# Patient Record
Sex: Female | Born: 1955 | Race: White | Hispanic: No | Marital: Single | Smoking: Never smoker
Health system: Southern US, Community
[De-identification: ages and names within clinical notes are randomized; demographics above are authoritative.]

---

## 1989-06-29 HISTORY — PX: AUGMENTATION MAMMAPLASTY: SUR837

## 1992-06-29 HISTORY — PX: BREAST IMPLANT REMOVAL: SUR1101

## 1998-06-17 ENCOUNTER — Ambulatory Visit (HOSPITAL_COMMUNITY): Admission: RE | Admit: 1998-06-17 | Discharge: 1998-06-17 | Payer: Self-pay | Admitting: *Deleted

## 1998-07-22 ENCOUNTER — Other Ambulatory Visit: Admission: RE | Admit: 1998-07-22 | Discharge: 1998-07-22 | Payer: Self-pay | Admitting: *Deleted

## 1998-09-25 ENCOUNTER — Ambulatory Visit (HOSPITAL_COMMUNITY): Admission: RE | Admit: 1998-09-25 | Discharge: 1998-09-25 | Payer: Self-pay | Admitting: Internal Medicine

## 1998-09-25 ENCOUNTER — Encounter: Payer: Self-pay | Admitting: Internal Medicine

## 1999-05-16 ENCOUNTER — Other Ambulatory Visit: Admission: RE | Admit: 1999-05-16 | Discharge: 1999-05-16 | Payer: Self-pay | Admitting: *Deleted

## 2000-03-02 ENCOUNTER — Encounter: Payer: Self-pay | Admitting: Emergency Medicine

## 2000-03-02 ENCOUNTER — Emergency Department (HOSPITAL_COMMUNITY): Admission: EM | Admit: 2000-03-02 | Discharge: 2000-03-02 | Payer: Self-pay | Admitting: Emergency Medicine

## 2000-08-13 ENCOUNTER — Encounter: Admission: RE | Admit: 2000-08-13 | Discharge: 2000-08-13 | Payer: Self-pay | Admitting: Internal Medicine

## 2000-08-13 ENCOUNTER — Encounter: Payer: Self-pay | Admitting: Internal Medicine

## 2001-08-24 ENCOUNTER — Encounter: Admission: RE | Admit: 2001-08-24 | Discharge: 2001-08-24 | Payer: Self-pay | Admitting: Internal Medicine

## 2001-08-24 ENCOUNTER — Encounter: Payer: Self-pay | Admitting: Internal Medicine

## 2002-09-12 ENCOUNTER — Encounter: Payer: Self-pay | Admitting: Internal Medicine

## 2002-09-12 ENCOUNTER — Ambulatory Visit (HOSPITAL_COMMUNITY): Admission: RE | Admit: 2002-09-12 | Discharge: 2002-09-12 | Payer: Self-pay | Admitting: Internal Medicine

## 2003-08-23 ENCOUNTER — Encounter: Admission: RE | Admit: 2003-08-23 | Discharge: 2003-08-23 | Payer: Self-pay | Admitting: Internal Medicine

## 2005-01-28 ENCOUNTER — Emergency Department (HOSPITAL_COMMUNITY): Admission: EM | Admit: 2005-01-28 | Discharge: 2005-01-28 | Payer: Self-pay | Admitting: Emergency Medicine

## 2005-04-21 ENCOUNTER — Other Ambulatory Visit: Admission: RE | Admit: 2005-04-21 | Discharge: 2005-04-21 | Payer: Self-pay | Admitting: Obstetrics and Gynecology

## 2005-08-17 ENCOUNTER — Ambulatory Visit (HOSPITAL_COMMUNITY): Admission: RE | Admit: 2005-08-17 | Discharge: 2005-08-17 | Payer: Self-pay | Admitting: Internal Medicine

## 2005-10-29 ENCOUNTER — Encounter: Admission: RE | Admit: 2005-10-29 | Discharge: 2005-10-29 | Payer: Self-pay | Admitting: Internal Medicine

## 2006-03-03 ENCOUNTER — Encounter: Admission: RE | Admit: 2006-03-03 | Discharge: 2006-03-03 | Payer: Self-pay | Admitting: Internal Medicine

## 2006-11-25 ENCOUNTER — Other Ambulatory Visit: Admission: RE | Admit: 2006-11-25 | Discharge: 2006-11-25 | Payer: Self-pay | Admitting: Gynecology

## 2007-03-15 ENCOUNTER — Other Ambulatory Visit: Admission: RE | Admit: 2007-03-15 | Discharge: 2007-03-15 | Payer: Self-pay | Admitting: Gynecology

## 2007-06-03 ENCOUNTER — Ambulatory Visit (HOSPITAL_COMMUNITY): Admission: RE | Admit: 2007-06-03 | Discharge: 2007-06-03 | Payer: Self-pay | Admitting: Internal Medicine

## 2007-06-13 ENCOUNTER — Encounter: Admission: RE | Admit: 2007-06-13 | Discharge: 2007-06-13 | Payer: Self-pay | Admitting: Internal Medicine

## 2007-09-14 ENCOUNTER — Ambulatory Visit: Payer: Self-pay | Admitting: Internal Medicine

## 2007-10-05 ENCOUNTER — Ambulatory Visit: Payer: Self-pay | Admitting: Internal Medicine

## 2007-11-30 ENCOUNTER — Other Ambulatory Visit: Admission: RE | Admit: 2007-11-30 | Discharge: 2007-11-30 | Payer: Self-pay | Admitting: Gynecology

## 2008-07-12 ENCOUNTER — Ambulatory Visit (HOSPITAL_COMMUNITY): Admission: RE | Admit: 2008-07-12 | Discharge: 2008-07-12 | Payer: Self-pay | Admitting: Internal Medicine

## 2008-08-09 ENCOUNTER — Encounter: Admission: RE | Admit: 2008-08-09 | Discharge: 2008-08-09 | Payer: Self-pay | Admitting: Internal Medicine

## 2009-10-14 ENCOUNTER — Ambulatory Visit: Payer: Self-pay | Admitting: General Practice

## 2009-12-18 ENCOUNTER — Other Ambulatory Visit: Payer: Self-pay | Admitting: General Practice

## 2010-04-09 ENCOUNTER — Ambulatory Visit: Payer: Self-pay | Admitting: Internal Medicine

## 2010-05-05 ENCOUNTER — Ambulatory Visit: Payer: Self-pay | Admitting: Internal Medicine

## 2010-05-29 ENCOUNTER — Ambulatory Visit: Payer: Self-pay | Admitting: Internal Medicine

## 2010-06-29 ENCOUNTER — Ambulatory Visit: Payer: Self-pay | Admitting: Internal Medicine

## 2010-07-20 ENCOUNTER — Encounter: Payer: Self-pay | Admitting: Internal Medicine

## 2012-01-28 ENCOUNTER — Ambulatory Visit: Payer: Self-pay | Admitting: Internal Medicine

## 2012-02-16 ENCOUNTER — Ambulatory Visit: Payer: Self-pay | Admitting: Physical Medicine and Rehabilitation

## 2013-03-01 ENCOUNTER — Ambulatory Visit: Payer: Self-pay | Admitting: Internal Medicine

## 2014-03-16 DIAGNOSIS — I10 Essential (primary) hypertension: Secondary | ICD-10-CM | POA: Insufficient documentation

## 2014-05-17 ENCOUNTER — Ambulatory Visit: Payer: Self-pay | Admitting: Internal Medicine

## 2015-01-14 ENCOUNTER — Encounter: Payer: Self-pay | Admitting: Internal Medicine

## 2015-02-21 ENCOUNTER — Other Ambulatory Visit: Payer: Self-pay | Admitting: Physician Assistant

## 2015-02-21 ENCOUNTER — Ambulatory Visit
Admission: RE | Admit: 2015-02-21 | Discharge: 2015-02-21 | Disposition: A | Discharge status: Home / Self Care | Payer: Managed Care, Other (non HMO) | Source: Ambulatory Visit | Attending: Physician Assistant | Admitting: Physician Assistant

## 2015-02-21 DIAGNOSIS — M25572 Pain in left ankle and joints of left foot: Secondary | ICD-10-CM | POA: Diagnosis not present

## 2015-02-21 DIAGNOSIS — R52 Pain, unspecified: Secondary | ICD-10-CM

## 2015-02-21 DIAGNOSIS — M25475 Effusion, left foot: Secondary | ICD-10-CM | POA: Diagnosis not present

## 2015-02-21 DIAGNOSIS — T1490XA Injury, unspecified, initial encounter: Secondary | ICD-10-CM

## 2015-02-21 DIAGNOSIS — R609 Edema, unspecified: Secondary | ICD-10-CM

## 2015-04-15 ENCOUNTER — Other Ambulatory Visit: Payer: Self-pay | Admitting: Internal Medicine

## 2015-04-15 DIAGNOSIS — Z1231 Encounter for screening mammogram for malignant neoplasm of breast: Secondary | ICD-10-CM

## 2015-05-21 ENCOUNTER — Ambulatory Visit: Payer: Managed Care, Other (non HMO)

## 2015-05-22 ENCOUNTER — Ambulatory Visit
Admission: RE | Admit: 2015-05-22 | Discharge: 2015-05-22 | Disposition: A | Discharge status: Home / Self Care | Payer: Managed Care, Other (non HMO) | Source: Ambulatory Visit | Attending: Internal Medicine | Admitting: Internal Medicine

## 2015-05-22 DIAGNOSIS — Z1231 Encounter for screening mammogram for malignant neoplasm of breast: Secondary | ICD-10-CM | POA: Insufficient documentation

## 2015-06-14 ENCOUNTER — Encounter: Payer: Self-pay | Admitting: Physician Assistant

## 2015-06-14 ENCOUNTER — Ambulatory Visit: Payer: Self-pay | Admitting: Physician Assistant

## 2015-06-14 VITALS — BP 130/80 | HR 84 | Temp 97.9°F

## 2015-06-14 DIAGNOSIS — T148XXA Other injury of unspecified body region, initial encounter: Secondary | ICD-10-CM

## 2015-06-14 NOTE — Progress Notes (Signed)
S: noticed a bruise on left inner thigh, now has a knot is same area, area is tender, no redness or swelling in leg  O: vitals wnl, nad, skin intact, small pea sized knot is area of bruise, no redness or warmth, n/v intact  A: hematoma  P: asa qd x 1 week, warm compress

## 2015-06-26 ENCOUNTER — Other Ambulatory Visit: Payer: Self-pay | Admitting: Emergency Medicine

## 2015-06-26 MED ORDER — OMEPRAZOLE 20 MG PO CPDR: 20.0000 mg | DELAYED_RELEASE_CAPSULE | Freq: Every day | ORAL | Status: DC

## 2015-06-26 NOTE — Telephone Encounter (Signed)
Received a faxed medication request from [facility].  Please advise.  Thank you.

## 2015-06-26 NOTE — Telephone Encounter (Signed)
Med refill approved 

## 2015-07-02 ENCOUNTER — Ambulatory Visit: Payer: Self-pay | Admitting: Family

## 2015-07-02 ENCOUNTER — Encounter: Payer: Self-pay | Admitting: Family

## 2015-07-02 VITALS — BP 130/90 | HR 82 | Temp 98.7°F | Resp 16

## 2015-07-02 DIAGNOSIS — E781 Pure hyperglyceridemia: Secondary | ICD-10-CM | POA: Insufficient documentation

## 2015-07-02 DIAGNOSIS — R739 Hyperglycemia, unspecified: Secondary | ICD-10-CM | POA: Insufficient documentation

## 2015-07-02 DIAGNOSIS — F1910 Other psychoactive substance abuse, uncomplicated: Secondary | ICD-10-CM | POA: Insufficient documentation

## 2015-07-02 DIAGNOSIS — M1990 Unspecified osteoarthritis, unspecified site: Secondary | ICD-10-CM | POA: Insufficient documentation

## 2015-07-02 DIAGNOSIS — Z8639 Personal history of other endocrine, nutritional and metabolic disease: Secondary | ICD-10-CM | POA: Insufficient documentation

## 2015-07-02 DIAGNOSIS — E039 Hypothyroidism, unspecified: Secondary | ICD-10-CM | POA: Insufficient documentation

## 2015-07-02 MED ORDER — FLUCONAZOLE 150 MG PO TABS: 150.0000 mg | ORAL_TABLET | Freq: Once | ORAL | Status: DC

## 2015-07-02 MED ORDER — LEVOFLOXACIN 500 MG PO TABS: 500.0000 mg | ORAL_TABLET | Freq: Every day | ORAL | Status: DC

## 2015-07-02 MED ORDER — BENZONATATE 200 MG PO CAPS: 200.0000 mg | ORAL_CAPSULE | Freq: Three times a day (TID) | ORAL | Status: DC | PRN

## 2015-07-02 MED ORDER — FLUCONAZOLE 200 MG PO TABS: 200.0000 mg | ORAL_TABLET | Freq: Every day | ORAL | Status: DC

## 2015-07-02 NOTE — Progress Notes (Signed)
S/ 3-4 d hx of deep cough and chest congestion , getting worse, fatigue , cough up yellow thick mucous , hears rattling in chest , a few years ago she had pneumonia  Non smoker, taking muccinex   O/ alert mildly ill, deep cough  VSS NAD ENT unremarkable, neck supple Heart rsr LUngs clear  A/ bronchitis P/levaquin 500 mg , tessalon pearles prn, diflucan for prn . Supportive measures encouraged. F/u prn not improving .

## 2015-07-09 ENCOUNTER — Ambulatory Visit: Payer: Self-pay | Admitting: Physician Assistant

## 2015-07-09 ENCOUNTER — Encounter: Payer: Self-pay | Admitting: Physician Assistant

## 2015-07-09 VITALS — BP 140/82 | HR 80 | Temp 99.1°F

## 2015-07-09 DIAGNOSIS — J209 Acute bronchitis, unspecified: Secondary | ICD-10-CM

## 2015-07-09 MED ORDER — IPRATROPIUM-ALBUTEROL 0.5-2.5 (3) MG/3ML IN SOLN
3.0000 mL | Freq: Once | RESPIRATORY_TRACT | Status: AC
  Administered 2015-07-10: 3 mL via RESPIRATORY_TRACT

## 2015-07-09 MED ORDER — PREDNISONE 10 MG (21) PO TBPK: 10.0000 mg | ORAL_TABLET | Freq: Every day | ORAL | Status: DC

## 2015-07-09 MED ORDER — HYDROCOD POLST-CPM POLST ER 10-8 MG/5ML PO SUER: 5.0000 mL | Freq: Two times a day (BID) | ORAL | Status: DC | PRN

## 2015-07-09 MED ORDER — CEFDINIR 300 MG PO CAPS: 300.0000 mg | ORAL_CAPSULE | Freq: Two times a day (BID) | ORAL | Status: DC

## 2015-07-09 MED ORDER — FLUCONAZOLE 150 MG PO TABS: ORAL_TABLET | ORAL | Status: DC

## 2015-07-09 NOTE — Progress Notes (Signed)
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

## 2015-07-10 ENCOUNTER — Ambulatory Visit
Admission: RE | Admit: 2015-07-10 | Discharge: 2015-07-10 | Disposition: A | Discharge status: Home / Self Care | Payer: Managed Care, Other (non HMO) | Source: Ambulatory Visit | Attending: Family | Admitting: Family

## 2015-07-10 ENCOUNTER — Other Ambulatory Visit: Payer: Self-pay | Admitting: Family

## 2015-07-10 ENCOUNTER — Ambulatory Visit: Payer: Self-pay | Admitting: Physician Assistant

## 2015-07-10 ENCOUNTER — Encounter: Payer: Self-pay | Admitting: Physician Assistant

## 2015-07-10 DIAGNOSIS — R05 Cough: Secondary | ICD-10-CM | POA: Diagnosis present

## 2015-07-10 DIAGNOSIS — J209 Acute bronchitis, unspecified: Secondary | ICD-10-CM

## 2015-07-10 DIAGNOSIS — R059 Cough, unspecified: Secondary | ICD-10-CM

## 2015-07-10 MED ORDER — IPRATROPIUM-ALBUTEROL 0.5-2.5 (3) MG/3ML IN SOLN
3.0000 mL | Freq: Once | RESPIRATORY_TRACT | Status: AC
  Administered 2015-07-10: 3 mL via RESPIRATORY_TRACT

## 2015-07-10 NOTE — Progress Notes (Signed)
S/ no better ,  Deep cough, SOB , On omnicef , prednisone without improvement   O/ alert not toxic but deep congested cough Rhonchi R  Posterior chest not clearing with cough  duoneb tx with subjective improvement but chest sounds unchanged  A/ Bronchitis -rule out pneumonia P rx written for neb machine and supplies.  Refer for cxr .

## 2015-07-10 NOTE — Addendum Note (Signed)
Addended by: Mirian MoMOORE, TOMMIE A on: 07/10/2015 03:23 PM   Modules accepted: Orders

## 2015-07-29 ENCOUNTER — Other Ambulatory Visit: Payer: Self-pay | Admitting: Physician Assistant

## 2015-07-29 DIAGNOSIS — J40 Bronchitis, not specified as acute or chronic: Secondary | ICD-10-CM

## 2015-07-29 MED ORDER — LEVOFLOXACIN 500 MG PO TABS: 500.0000 mg | ORAL_TABLET | Freq: Every day | ORAL | Status: DC

## 2015-07-29 NOTE — Progress Notes (Signed)
Phone call Pt still coughing up green mucus, no fever/chills, is better than she was but cough has been going on for a month Sent rx for levaquin to [facility]

## 2015-10-15 DIAGNOSIS — F325 Major depressive disorder, single episode, in full remission: Secondary | ICD-10-CM | POA: Insufficient documentation

## 2015-10-30 ENCOUNTER — Other Ambulatory Visit: Payer: Self-pay | Admitting: Physician Assistant

## 2015-10-30 NOTE — Telephone Encounter (Signed)
Med refill approved 

## 2016-02-11 ENCOUNTER — Other Ambulatory Visit: Payer: Self-pay | Admitting: Physician Assistant

## 2016-02-11 NOTE — Telephone Encounter (Signed)
Left message for patient to contact office. Needing additional information on why patient request this medicaton.

## 2016-02-11 NOTE — Telephone Encounter (Signed)
Patient returned call stating that Darl PikesSusan had prescribed this medication to her a while back when her cat scratched her and recently her daughters dog scratched her and the dog is up to date on shots and scratch is healing well but wanted it just in case and her husband accidentally threw medication  away.  Contacted patient back and left message that Per Darl PikesSusan if scratch is healing well doesn't require this medication.  So if she feel as though she needs it she would need to be seen so Darl PikesSusan can take a look at it doesn't want her body to become resistant to this medication.

## 2016-02-11 NOTE — Telephone Encounter (Signed)
Dois DavenportSandra,  Can you find out why she needs this?  I don't want her getting resistant to any antibiotics and not sure why she needs a refill

## 2016-04-15 ENCOUNTER — Other Ambulatory Visit: Payer: Self-pay | Admitting: Internal Medicine

## 2016-04-15 DIAGNOSIS — Z1231 Encounter for screening mammogram for malignant neoplasm of breast: Secondary | ICD-10-CM

## 2016-05-14 ENCOUNTER — Other Ambulatory Visit: Payer: Self-pay | Admitting: Physician Assistant

## 2016-05-14 DIAGNOSIS — J40 Bronchitis, not specified as acute or chronic: Secondary | ICD-10-CM

## 2016-05-14 MED ORDER — AZITHROMYCIN 250 MG PO TABS: ORAL_TABLET | ORAL | 0 refills | Status: DC

## 2016-05-14 MED ORDER — BENZONATATE 200 MG PO CAPS: 200.0000 mg | ORAL_CAPSULE | Freq: Three times a day (TID) | ORAL | 0 refills | Status: DC | PRN

## 2016-05-14 NOTE — Progress Notes (Signed)
S: pt c/o cough and congestion, got really bad last year and would like to get ahead of it before she gets to that point, ?if could get rx for tessalon perls, no fever chills, cp/sob  O: phone consult  A: uri  P: zpack, tessalon

## 2016-05-18 ENCOUNTER — Other Ambulatory Visit: Payer: Self-pay | Admitting: Physician Assistant

## 2016-05-18 MED ORDER — PREDNISONE 10 MG (21) PO TBPK: 10.0000 mg | ORAL_TABLET | Freq: Every day | ORAL | 0 refills | Status: DC

## 2016-05-18 NOTE — Progress Notes (Signed)
Pt still coughing, is getting harsher and more barking like last year, steroids helped last year, sent in rx [facility]

## 2016-05-20 ENCOUNTER — Other Ambulatory Visit: Payer: Self-pay | Admitting: Physician Assistant

## 2016-05-20 DIAGNOSIS — J209 Acute bronchitis, unspecified: Secondary | ICD-10-CM

## 2016-05-20 NOTE — Telephone Encounter (Signed)
pt has been on antibiotic, now has yeast, rx for diflucan called in

## 2016-05-27 ENCOUNTER — Ambulatory Visit
Admission: RE | Admit: 2016-05-27 | Discharge: 2016-05-27 | Disposition: A | Discharge status: Home / Self Care | Payer: Managed Care, Other (non HMO) | Source: Ambulatory Visit | Attending: Internal Medicine | Admitting: Internal Medicine

## 2016-05-27 ENCOUNTER — Encounter: Payer: Self-pay | Admitting: Radiology

## 2016-05-27 DIAGNOSIS — Z1231 Encounter for screening mammogram for malignant neoplasm of breast: Secondary | ICD-10-CM

## 2016-10-07 ENCOUNTER — Other Ambulatory Visit: Payer: Self-pay | Admitting: Physician Assistant

## 2016-10-07 MED ORDER — MONTELUKAST SODIUM 10 MG PO TABS: 10.0000 mg | ORAL_TABLET | Freq: Every day | ORAL | 3 refills | Status: DC

## 2016-10-07 MED ORDER — FLUTICASONE PROPIONATE 50 MCG/ACT NA SUSP: 2.0000 | Freq: Every day | NASAL | 6 refills | Status: DC

## 2016-10-07 NOTE — Progress Notes (Unsigned)
S: pt called the office and her allergies are worsening, getting dizzy from the inner congestion, eyes are watering, nose is running, no cp/sob/fever/chills  O: phone call  A: seasonal allergies  P: flonase, singulair, continue zyrtec

## 2016-12-01 ENCOUNTER — Ambulatory Visit: Payer: Self-pay | Admitting: Physician Assistant

## 2016-12-01 ENCOUNTER — Encounter: Payer: Self-pay | Admitting: Physician Assistant

## 2016-12-01 VITALS — BP 136/75 | HR 85 | Temp 97.8°F

## 2016-12-01 DIAGNOSIS — J069 Acute upper respiratory infection, unspecified: Secondary | ICD-10-CM

## 2016-12-01 MED ORDER — AZITHROMYCIN 250 MG PO TABS: ORAL_TABLET | ORAL | 0 refills | Status: DC

## 2016-12-01 MED ORDER — METHYLPREDNISOLONE 4 MG PO TBPK: ORAL_TABLET | ORAL | 0 refills | Status: DC

## 2016-12-01 MED ORDER — FLUTICASONE PROPIONATE 50 MCG/ACT NA SUSP: 2.0000 | Freq: Every day | NASAL | 6 refills | Status: AC

## 2016-12-01 NOTE — Progress Notes (Signed)
S: C/o sore throat and congestion for 7-10 days, no fever, chills, cp/sob, v/d; mucus was green this am but clear throughout the day, cough is sporadic, dry and hacking, has hx of getting bronchitis easily, feels like she has 2 golf balls in her throat  Using otc meds:   O: PE: vitals wnl, nad, perrl eomi, normocephalic, tms dull, nasal mucosa red and swollen, throat injected, neck supple + lymph at anterior cervical b/l, lungs c t a, cv rrr, neuro intact, cough is deep dry and hacking  A:  Acute  uri   P: drink fluids, continue regular meds , use otc meds of choice, return if not improving in 5 days, return earlier if worsening , zpack medrol dose pack, flonase

## 2017-04-07 ENCOUNTER — Ambulatory Visit: Payer: Self-pay | Admitting: Family

## 2017-04-07 ENCOUNTER — Encounter: Payer: Self-pay | Admitting: Family

## 2017-04-07 VITALS — BP 137/67 | HR 67

## 2017-04-07 DIAGNOSIS — R42 Dizziness and giddiness: Secondary | ICD-10-CM

## 2017-04-07 NOTE — Progress Notes (Signed)
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 She denies headache, chest pain, shortness of breath or neurological symptoms. She is recovering from what sounds like flu 5 days ago. She returned to work yesterday and feels that she in retrospect should not have, because she was so busy she did not eat or drink. She noted the onset of a large axillary tender mass with the febrile illness which is getting smaller .  O/alert ,pleasant  blood pressure is normal and not orthostatic  PERRLA ,EOMS full without nystagmas  ENT :TMS are  Clear and exam unremarkable Neck supple without nodes  Heart rsr lungs are clear R axilla with large moveable mass Gross neuro normal  A/ vertigo ? Post viral  P supportive measures. She declines rx . Urged to change her position slowly. Return to clinic if symptoms worsen. She will continue to monitor the axillary mass which appears to be getting smaller but will discuss it with her primary care physician at her up coming physical. She states her breast exam is normal.

## 2017-04-13 ENCOUNTER — Other Ambulatory Visit: Payer: Self-pay | Admitting: Internal Medicine

## 2017-04-13 DIAGNOSIS — Z1231 Encounter for screening mammogram for malignant neoplasm of breast: Secondary | ICD-10-CM

## 2017-05-03 ENCOUNTER — Ambulatory Visit
Admission: RE | Admit: 2017-05-03 | Discharge: 2017-05-03 | Disposition: A | Discharge status: Home / Self Care | Payer: Commercial Managed Care - PPO | Source: Ambulatory Visit | Attending: Internal Medicine | Admitting: Internal Medicine

## 2017-05-03 DIAGNOSIS — Z1231 Encounter for screening mammogram for malignant neoplasm of breast: Secondary | ICD-10-CM | POA: Diagnosis present

## 2017-05-07 ENCOUNTER — Encounter: Payer: Self-pay | Admitting: Physician Assistant

## 2017-05-07 ENCOUNTER — Ambulatory Visit: Payer: Self-pay | Admitting: Physician Assistant

## 2017-05-07 DIAGNOSIS — K589 Irritable bowel syndrome without diarrhea: Secondary | ICD-10-CM

## 2017-05-07 MED ORDER — DICYCLOMINE HCL 10 MG PO CAPS: 10.0000 mg | ORAL_CAPSULE | Freq: Three times a day (TID) | ORAL | 6 refills | Status: DC

## 2017-05-07 NOTE — Progress Notes (Signed)
Pt needs refill on bentyl, rx sent to [facility]

## 2017-05-11 ENCOUNTER — Other Ambulatory Visit: Payer: Self-pay | Admitting: Physician Assistant

## 2017-05-11 MED ORDER — ONDANSETRON HCL 4 MG PO TABS: 4.0000 mg | ORAL_TABLET | Freq: Three times a day (TID) | ORAL | 0 refills | Status: DC | PRN

## 2017-05-11 MED ORDER — AZITHROMYCIN 250 MG PO TABS: ORAL_TABLET | ORAL | 0 refills | Status: DC

## 2017-05-11 MED ORDER — CIPROFLOXACIN HCL 500 MG PO TABS: 500.0000 mg | ORAL_TABLET | Freq: Two times a day (BID) | ORAL | 0 refills | Status: DC

## 2017-05-11 NOTE — Progress Notes (Signed)
Pt is travelling to [place], ?if could get travel meds, zpack if uri, cipro if traveler's diarrhea, zofran for nausea, pt is to get otc immodium for diarrhea

## 2017-05-26 ENCOUNTER — Ambulatory Visit: Payer: Self-pay | Admitting: Emergency Medicine

## 2017-05-26 ENCOUNTER — Telehealth: Payer: Self-pay | Admitting: Emergency Medicine

## 2017-05-26 NOTE — Telephone Encounter (Signed)
Cipro 500mg  bid x 3 days

## 2017-11-03 ENCOUNTER — Encounter: Payer: Self-pay | Admitting: Internal Medicine

## 2018-04-25 ENCOUNTER — Other Ambulatory Visit: Payer: Self-pay | Admitting: Internal Medicine

## 2018-04-25 DIAGNOSIS — Z1231 Encounter for screening mammogram for malignant neoplasm of breast: Secondary | ICD-10-CM

## 2018-05-12 ENCOUNTER — Ambulatory Visit
Admission: RE | Admit: 2018-05-12 | Discharge: 2018-05-12 | Disposition: A | Discharge status: Home / Self Care | Payer: Commercial Managed Care - PPO | Source: Ambulatory Visit | Attending: Internal Medicine | Admitting: Internal Medicine

## 2018-05-12 DIAGNOSIS — Z1231 Encounter for screening mammogram for malignant neoplasm of breast: Secondary | ICD-10-CM | POA: Insufficient documentation

## 2019-03-30 ENCOUNTER — Other Ambulatory Visit: Payer: Self-pay | Admitting: Internal Medicine

## 2019-03-30 DIAGNOSIS — Z1231 Encounter for screening mammogram for malignant neoplasm of breast: Secondary | ICD-10-CM

## 2019-04-29 ENCOUNTER — Ambulatory Visit: Admit: 2019-04-29 | Payer: Managed Care, Other (non HMO) | Admitting: Ophthalmology

## 2019-04-29 SURGERY — PHACOEMULSIFICATION, CATARACT, WITH IOL INSERTION
Anesthesia: Topical | Laterality: Right

## 2019-05-15 ENCOUNTER — Ambulatory Visit
Admission: RE | Admit: 2019-05-15 | Discharge: 2019-05-15 | Disposition: A | Discharge status: Home / Self Care | Payer: Commercial Managed Care - PPO | Source: Ambulatory Visit | Attending: Internal Medicine | Admitting: Internal Medicine

## 2019-05-15 DIAGNOSIS — Z1231 Encounter for screening mammogram for malignant neoplasm of breast: Secondary | ICD-10-CM | POA: Diagnosis present

## 2020-04-19 ENCOUNTER — Other Ambulatory Visit: Payer: Self-pay | Admitting: Internal Medicine

## 2020-04-19 DIAGNOSIS — Z1231 Encounter for screening mammogram for malignant neoplasm of breast: Secondary | ICD-10-CM

## 2020-05-28 ENCOUNTER — Ambulatory Visit
Admission: RE | Admit: 2020-05-28 | Discharge: 2020-05-28 | Disposition: A | Discharge status: Home / Self Care | Payer: Commercial Managed Care - PPO | Source: Ambulatory Visit | Attending: Internal Medicine | Admitting: Internal Medicine

## 2020-05-28 ENCOUNTER — Other Ambulatory Visit: Payer: Self-pay

## 2020-05-28 DIAGNOSIS — Z1231 Encounter for screening mammogram for malignant neoplasm of breast: Secondary | ICD-10-CM | POA: Insufficient documentation

## 2020-07-03 ENCOUNTER — Other Ambulatory Visit: Payer: Self-pay | Admitting: Internal Medicine

## 2020-08-08 DIAGNOSIS — F1911 Other psychoactive substance abuse, in remission: Secondary | ICD-10-CM | POA: Insufficient documentation

## 2020-08-09 ENCOUNTER — Other Ambulatory Visit: Payer: Self-pay

## 2020-08-09 ENCOUNTER — Encounter: Payer: Self-pay | Admitting: Podiatry

## 2020-08-09 ENCOUNTER — Ambulatory Visit: Payer: Medicare HMO | Admitting: Podiatry

## 2020-08-09 ENCOUNTER — Ambulatory Visit (INDEPENDENT_AMBULATORY_CARE_PROVIDER_SITE_OTHER): Payer: Medicare HMO

## 2020-08-09 DIAGNOSIS — M205X1 Other deformities of toe(s) (acquired), right foot: Secondary | ICD-10-CM

## 2020-08-09 DIAGNOSIS — M2040 Other hammer toe(s) (acquired), unspecified foot: Secondary | ICD-10-CM

## 2020-08-09 DIAGNOSIS — M2042 Other hammer toe(s) (acquired), left foot: Secondary | ICD-10-CM | POA: Diagnosis not present

## 2020-08-09 DIAGNOSIS — M2041 Other hammer toe(s) (acquired), right foot: Secondary | ICD-10-CM | POA: Diagnosis not present

## 2020-08-09 NOTE — Patient Instructions (Signed)
Pre-Operative Instructions  Congratulations, you have decided to take an important step towards improving your quality of life.  You can be assured that the doctors and staff at [facility] will be with you every step of the way.  Here are some important things you should know:  1. Plan to be at the surgery center/hospital at least 1 (one) hour prior to your scheduled time, unless otherwise directed by the surgical center/hospital staff.  You must have a responsible adult accompany you, remain during the surgery and drive you home.  Make sure you have directions to the surgical center/hospital to ensure you arrive on time. 2. If you are having surgery at [facility], you will need a copy of your medical history and physical form from your family physician within one month prior to the date of surgery. We will give you a form for your primary physician to complete.  3. We make every effort to accommodate the date you request for surgery.  However, there are times where surgery dates or times have to be moved.  We will contact you as soon as possible if a change in schedule is required.   4. No aspirin/ibuprofen for one week before surgery.  If you are on aspirin, any non-steroidal anti-inflammatory medications (Mobic, Aleve, Ibuprofen) should not be taken seven (7) days prior to your surgery.  You make take Tylenol for pain prior to surgery.  5. Medications - If you are taking daily heart and blood pressure medications, seizure, reflux, allergy, asthma, anxiety, pain or diabetes medications, make sure you notify the surgery center/hospital before the day of surgery so they can tell you which medications you should take or avoid the day of surgery. 6. No food or drink after midnight the night before surgery unless directed otherwise by surgical center/hospital staff. 7. No alcoholic beverages 24-hours prior to surgery.  No smoking 24-hours prior or 24-hours after  surgery. 8. Wear loose pants or shorts. They should be loose enough to fit over bandages, boots, and casts. 9. Don't wear slip-on shoes. Sneakers are preferred. 10. Bring your boot with you to the surgery center/hospital.  Also bring crutches or a walker if your physician has prescribed it for you.  If you do not have this equipment, it will be provided for you after surgery. 11. If you have not been contacted by the surgery center/hospital by the day before your surgery, call to confirm the date and time of your surgery. 12. Leave-time from work may vary depending on the type of surgery you have.  Appropriate arrangements should be made prior to surgery with your employer. 13. Prescriptions will be provided immediately following surgery by your doctor.  Fill these as soon as possible after surgery and take the medication as directed. Pain medications will not be refilled on weekends and must be approved by the doctor. 14. Remove nail polish on the operative foot and avoid getting pedicures prior to surgery. 15. Wash the night before surgery.  The night before surgery wash the foot and leg well with water and the antibacterial soap provided. Be sure to pay special attention to beneath the toenails and in between the toes.  Wash for at least three (3) minutes. Rinse thoroughly with water and dry well with a towel.  Perform this wash unless told not to do so by your physician.  Enclosed: 1 Ice pack (please put in freezer the night before surgery)   1 Hibiclens skin cleaner     Pre-op instructions  If you have any questions regarding the instructions, please do not hesitate to call our office.  [place]: [address], [place], [place] [address] -- [phone]  [place]: [address] [place], [place] [address] -- [phone]  [place]: [address], [place], [place] [address] -- [phone]   Website: [email or web address] 

## 2020-08-09 NOTE — Progress Notes (Addendum)
HPI: 65 y.o. female presenting today as a new patient for evaluation of bilateral foot pain that is been going on for several years.  The patient's greatest concern today is her right great toe joint.  She is very active and walks approximately 4 miles per day with her neighborhood.  She states that she is dealt with foot pain that radiates around her great toe joint for several years.  Her neighbor recommended that she comes for evaluation.  She has tried multiple conservative modalities including anti-inflammatories and shoe gear modifications with minimal improvement or relief. She also has a history of hammertoe repair to the bilateral digits performed by another surgeon in [place].  She states that the toes healed crooked.  They are very symptomatic and cause overlying corns and calluses to develop.  She would like to have them addressed as well.  No past medical history on file.   Physical Exam: General: The patient is alert and oriented x3 in no acute distress.  Dermatology: Skin is warm, dry and supple bilateral lower extremities. Negative for open lesions or macerations.  Overlying hyperkeratotic corns noted to the fourth and fifth digits of the right foot as well as the fourth digit of the left foot.  Vascular: Palpable pedal pulses bilaterally. No edema or erythema noted. Capillary refill within normal limits.  Neurological: Epicritic and protective threshold grossly intact bilaterally.   Musculoskeletal Exam: Range of motion within normal limits to all pedal and ankle joints bilateral. Muscle strength 5/5 in all groups bilateral.  Crepitus with pain on palpation and limited range of motion noted to the first MTPJ of the right foot.  Findings consistent with a hallux limitus, advanced Adductovarus hammertoe contracture deformity also noted to the fourth digits bilateral as well as the fifth digit to the right foot  Radiographic Exam:  Normal osseous mineralization. Joint spaces  preserved. No fracture/dislocation/boney destruction.  Degenerative changes with joint space narrowing noted and periarticular spurring to the first MTPJ of the right foot.  Assessment: 1.  Hallux limitus right 2.  Hammertoe fourth digit bilateral, fifth digit right 3.  H/o hammertoe repair fourth and fifth digits right, fourth digit left   Plan of Care:  1. Patient evaluated. X-Rays reviewed.  2. Today we discussed the conservative versus surgical management of the presenting pathology. The patient opts for surgical management. All possible complications and details of the procedure were explained. All patient questions were answered. No guarantees were expressed or implied. 3. Authorization for surgery was initiated today. Surgery will consist of right great toe arthroplasty with implant.  Hammertoe arthroplasty fourth digit bilateral, fifth digit right with percutaneous temporary K wire fixation.  Patient states that her last hammertoe surgery did not have K wire fixation which caused her toes to heal quickly 4.  Cam boot dispensed for right lower extremity postoperative.  Postsurgical shoe dispensed for left foot postoperative.  5.  Return to clinic 1 week postop  *Neighbor who recommended she comes to the office is Nicole Pruitt (patient). [name]     Nicole Pruitt, DPM [facility]  Dr. Felecia Pruitt, DPM    [address] [place], [place] [address]  Office [phone]  Fax [phone]

## 2020-09-10 ENCOUNTER — Telehealth: Payer: Self-pay | Admitting: Urology

## 2020-09-10 NOTE — Telephone Encounter (Signed)
DOS: 09/19/20  KELLER BUNION IMPLANT RT --- 80223 HAMMER TOE REPAIR 4TH B/L & 5TH RT -- 28285   SPOKE WITH [place] AND [employer] WEB SITE AND STATED FOR THE CPT CODES 36122 AND [id number] THE REQUESTED SERVICE DOES NOT REQUIRE PRECERTIFICATION BUT MAY NOT BE ELIGIBLE FOR COVERAGE UNDER THE MEMBERS PLAN PLEASE REFER TO ONLINE CLINICAL POLICY BULLETINS USING THE [place] OR CONTACT PROVIDER SERVICE CENTER  REF # [id number]

## 2020-09-19 ENCOUNTER — Other Ambulatory Visit: Payer: Self-pay | Admitting: Podiatry

## 2020-09-19 ENCOUNTER — Encounter: Payer: Self-pay | Admitting: Podiatry

## 2020-09-19 DIAGNOSIS — M205X1 Other deformities of toe(s) (acquired), right foot: Secondary | ICD-10-CM

## 2020-09-19 DIAGNOSIS — M2041 Other hammer toe(s) (acquired), right foot: Secondary | ICD-10-CM | POA: Diagnosis not present

## 2020-09-19 DIAGNOSIS — M2042 Other hammer toe(s) (acquired), left foot: Secondary | ICD-10-CM | POA: Diagnosis not present

## 2020-09-19 MED ORDER — PROMETHAZINE HCL 12.5 MG PO TABS: 12.5000 mg | ORAL_TABLET | Freq: Three times a day (TID) | ORAL | 0 refills | Status: AC | PRN

## 2020-09-19 MED ORDER — OXYCODONE-ACETAMINOPHEN 5-325 MG PO TABS: 1.0000 | ORAL_TABLET | ORAL | 0 refills | Status: DC | PRN

## 2020-09-19 NOTE — Progress Notes (Signed)
PRN postop 

## 2020-09-27 ENCOUNTER — Ambulatory Visit (INDEPENDENT_AMBULATORY_CARE_PROVIDER_SITE_OTHER): Payer: Medicare HMO

## 2020-09-27 ENCOUNTER — Encounter: Payer: Self-pay | Admitting: Podiatry

## 2020-09-27 ENCOUNTER — Other Ambulatory Visit: Payer: Self-pay

## 2020-09-27 ENCOUNTER — Ambulatory Visit (INDEPENDENT_AMBULATORY_CARE_PROVIDER_SITE_OTHER): Payer: Medicare HMO | Admitting: Podiatry

## 2020-09-27 VITALS — Temp 98.2°F

## 2020-09-27 DIAGNOSIS — M205X1 Other deformities of toe(s) (acquired), right foot: Secondary | ICD-10-CM

## 2020-09-27 DIAGNOSIS — M2042 Other hammer toe(s) (acquired), left foot: Secondary | ICD-10-CM

## 2020-09-27 DIAGNOSIS — Z9889 Other specified postprocedural states: Secondary | ICD-10-CM

## 2020-09-27 DIAGNOSIS — M2040 Other hammer toe(s) (acquired), unspecified foot: Secondary | ICD-10-CM

## 2020-09-27 MED ORDER — OXYCODONE-ACETAMINOPHEN 5-325 MG PO TABS: 1.0000 | ORAL_TABLET | Freq: Four times a day (QID) | ORAL | 0 refills | Status: AC | PRN

## 2020-09-27 NOTE — Progress Notes (Signed)
Dg  

## 2020-09-27 NOTE — Progress Notes (Signed)
   Subjective:  Patient presents today status post right great toe arthroplasty with implant and hammertoe correction fourth digit bilateral, fifth digit right. DOS: 09/19/2020.  Patient states she is doing well.  She has been minimally weightbearing in the postsurgical shoe and cam boot.  She is kept the dressings clean dry and intact.  No new complaints at this time  No past medical history on file.    Objective/Physical Exam Neurovascular status intact.  Skin incisions appear to be well coapted with sutures and staples intact. No sign of infectious process noted. No dehiscence. No active bleeding noted. Moderate edema noted to the surgical extremity.  Radiographic Exam:  Orthopedic implant and osteotomies sites appear to be stable with routine healing.  Assessment: 1. s/p right great toe arthroplasty with implant.  Hammertoe correction 4th b/l, 5th RT. DOS: 09/19/2020   Plan of Care:  1. Patient was evaluated. X-rays reviewed 2.  Dressings changed today.  Clean dry and intact x1 week 3.  Continue minimal weightbearing in the postsurgical shoe and cam boot 4.  Refill prescription for Percocet 5/325 mg  5.  Return to clinic in 1 week for suture removal and to discontinue the cam boot to the right foot.   Felecia Shelling, DPM [facility]  Dr. Felecia Shelling, DPM    [address] [place], [place] [address]                Office [phone]  Fax [phone]

## 2020-10-04 ENCOUNTER — Other Ambulatory Visit: Payer: Self-pay

## 2020-10-04 ENCOUNTER — Ambulatory Visit (INDEPENDENT_AMBULATORY_CARE_PROVIDER_SITE_OTHER): Payer: Medicare HMO | Admitting: Podiatry

## 2020-10-04 VITALS — Temp 98.1°F

## 2020-10-04 DIAGNOSIS — M205X1 Other deformities of toe(s) (acquired), right foot: Secondary | ICD-10-CM

## 2020-10-04 DIAGNOSIS — M2040 Other hammer toe(s) (acquired), unspecified foot: Secondary | ICD-10-CM

## 2020-10-04 DIAGNOSIS — Z9889 Other specified postprocedural states: Secondary | ICD-10-CM

## 2020-10-04 NOTE — Progress Notes (Signed)
   Subjective:  Patient presents today status post right great toe arthroplasty with implant and hammertoe correction fourth digit bilateral, fifth digit right. DOS: 09/19/2020.  Overall the patient is doing well.  She has minimal pain.  She has been keeping the dressings clean dry and intact as instructed.  No new complaints at this time  No past medical history on file.    Objective/Physical Exam Neurovascular status intact.  Skin incisions appear to be well coapted with sutures and staples intact. No sign of infectious process noted. No dehiscence. No active bleeding noted. Moderate edema noted to the surgical extremity.  Assessment: 1. s/p right great toe arthroplasty with implant.  Hammertoe correction 4th b/l, 5th RT. DOS: 09/19/2020   Plan of Care:  1. Patient was evaluated.  2.  Sutures and staples removed today 3.  Discontinue cam boot right foot. 4.  Postsurgical shoe dispensed.  Wear daily bilateral 5.  Return to clinic in 2 weeks for percutaneous fixation pin removal and follow-up x-rays  Felecia Shelling, DPM [facility]  Dr. Felecia Shelling, DPM    [address] [place], [place] [address]                Office [phone]  Fax [phone]

## 2020-10-18 ENCOUNTER — Encounter: Payer: Self-pay | Admitting: Podiatry

## 2020-10-18 ENCOUNTER — Ambulatory Visit (INDEPENDENT_AMBULATORY_CARE_PROVIDER_SITE_OTHER): Payer: Medicare HMO | Admitting: Podiatry

## 2020-10-18 ENCOUNTER — Other Ambulatory Visit: Payer: Self-pay | Admitting: Podiatry

## 2020-10-18 ENCOUNTER — Ambulatory Visit (INDEPENDENT_AMBULATORY_CARE_PROVIDER_SITE_OTHER): Payer: Medicare HMO

## 2020-10-18 ENCOUNTER — Other Ambulatory Visit: Payer: Self-pay

## 2020-10-18 ENCOUNTER — Ambulatory Visit: Payer: Medicare HMO

## 2020-10-18 DIAGNOSIS — Z9889 Other specified postprocedural states: Secondary | ICD-10-CM

## 2020-10-18 DIAGNOSIS — M2042 Other hammer toe(s) (acquired), left foot: Secondary | ICD-10-CM | POA: Diagnosis not present

## 2020-10-18 DIAGNOSIS — M2040 Other hammer toe(s) (acquired), unspecified foot: Secondary | ICD-10-CM

## 2020-10-19 ENCOUNTER — Encounter: Payer: Self-pay | Admitting: Podiatry

## 2020-11-03 NOTE — Progress Notes (Signed)
   Subjective:  Patient presents today status post right great toe arthroplasty with implant and hammertoe correction fourth digit bilateral, fifth digit right. DOS: 09/19/2020.  Overall the patient is doing well.  She has minimal pain.  She has been keeping the dressings clean dry and intact as instructed.  No new complaints at this time  No past medical history on file.    Objective/Physical Exam Neurovascular status intact.  Skin incisions appear to be well coapted and healed. No sign of infectious process noted. No dehiscence. No active bleeding noted. Moderate edema noted to the surgical extremity.  Radiographic exam Osteotomy sites are stable with routine healing.  Orthopedic hardware intact.  Assessment: 1. s/p right great toe arthroplasty with implant.  Hammertoe correction 4th b/l, 5th RT. DOS: 09/19/2020   Plan of Care:  1. Patient was evaluated.  2.  Percutaneous fixation pins removed today 3.  Patient may transition out of the postsurgical shoes into good supportive stability sneakers 4.  Return to clinic in 4 weeks  *Patient waited in Rm for 2 hours unknowing to me.   Felecia Shelling, DPM [facility]  Dr. Felecia Shelling, DPM    [address] [place], [place] [address]                Office [phone]  Fax [phone]

## 2020-11-15 ENCOUNTER — Ambulatory Visit (INDEPENDENT_AMBULATORY_CARE_PROVIDER_SITE_OTHER): Payer: Medicare HMO | Admitting: Podiatry

## 2020-11-15 ENCOUNTER — Other Ambulatory Visit: Payer: Self-pay

## 2020-11-15 DIAGNOSIS — Z9889 Other specified postprocedural states: Secondary | ICD-10-CM

## 2020-11-26 NOTE — Progress Notes (Signed)
   Subjective:  Patient presents today status post right great toe arthroplasty with implant and hammertoe correction fourth digit bilateral, fifth digit right. DOS: 09/19/2020.  Overall the patient is doing well.  Patient is doing well wearing good supportive shoes with no pain.  No new complaints at this time  No past medical history on file.    Objective/Physical Exam Neurovascular status intact.  Skin incisions appear to be well coapted and healed. No sign of infectious process noted. No dehiscence. No active bleeding noted. Moderate edema noted to the surgical extremity.  Negative for any significant pain on palpation to the surgical area  Radiographic exam Osteotomy sites are stable with routine healing.  Orthopedic hardware intact.  Assessment: 1. s/p right great toe arthroplasty with implant.  Hammertoe correction 4th b/l, 5th RT. DOS: 09/19/2020   Plan of Care:  1. Patient was evaluated.  2.  Overall the patient is doing very well.  Continue wearing good supportive sneakers and shoes 3.  Patient may resume full activity no restrictions 4.  Return to clinic as needed  Felecia Shelling, DPM [facility]  Dr. Felecia Shelling, DPM    [address] [place], [place] [address]                Office [phone]  Fax [phone]

## 2020-12-17 ENCOUNTER — Other Ambulatory Visit: Payer: Self-pay

## 2020-12-17 ENCOUNTER — Ambulatory Visit (INDEPENDENT_AMBULATORY_CARE_PROVIDER_SITE_OTHER): Payer: Medicare HMO | Admitting: Podiatry

## 2020-12-17 ENCOUNTER — Other Ambulatory Visit: Payer: Self-pay | Admitting: Podiatry

## 2020-12-17 ENCOUNTER — Ambulatory Visit (INDEPENDENT_AMBULATORY_CARE_PROVIDER_SITE_OTHER): Payer: Medicare HMO

## 2020-12-17 DIAGNOSIS — S92505A Nondisplaced unspecified fracture of left lesser toe(s), initial encounter for closed fracture: Secondary | ICD-10-CM

## 2020-12-17 DIAGNOSIS — M2040 Other hammer toe(s) (acquired), unspecified foot: Secondary | ICD-10-CM

## 2020-12-17 NOTE — Progress Notes (Signed)
   HPI: 65 y.o. female presenting today for new complaint regarding an injury that the patient sustained on 12/15/2020 when she stubbed her fourth and fifth toes on a coffee table left foot.  The toes are now bruised and it hurts to walk.  It is painful with ambulation.  She has been taking ibuprofen and icing her foot.  No past medical history on file.   Physical Exam: General: The patient is alert and oriented x3 in no acute distress.  Dermatology: No open skin lacerations noted.  Vascular: Palpable pedal pulses bilaterally.  Ecchymosis with edema noted to the fourth and fifth toes left foot  Neurological: Epicritic and protective threshold grossly intact bilaterally.   Musculoskeletal Exam: History of right great toe arthroplasty with implant.  Hammertoe correction fourth bilateral.  Fifth right.  DOS 09/19/2020.  The fourth and fifth toes in good alignment.  There is some adductovarus curvature of the fifth toe left foot  Radiographic Exam:  Osteotomy site from the head of the proximal phalanx fourth toe appears stable with good alignment of the toe.  Fracture noted to the distal head of the proximal phalanx fifth digit.  There is also small avulsion fracture from the base of the proximal phalanx.  Assessment: 1.  Fracture fifth toe left foot secondary to injury.  DOI: 12/15/2020   Plan of Care:  1. Patient evaluated. X-Rays reviewed.  2.  Patient has a postsurgical shoe at home.  Recommend that she wears the shoe daily.  Weightbearing as tolerated 3.  Reassured the patient that no surgery is needed at this time.  Recommend rest ice compression and elevation.  Explained to the patient that it will take a few months for the fractures to heal 4.  Continue ibuprofen as needed 5.  Return to clinic in 8 weeks for follow-up x-ray      Felecia Shelling, DPM [facility]  Dr. Felecia Shelling, DPM    [address] [place], [place]  [address]                Office [phone]  Fax [phone]

## 2020-12-24 ENCOUNTER — Ambulatory Visit: Payer: Medicare HMO | Admitting: Podiatry

## 2020-12-30 ENCOUNTER — Encounter: Payer: Self-pay | Admitting: Podiatry

## 2021-02-18 ENCOUNTER — Ambulatory Visit: Payer: Medicare HMO | Admitting: Podiatry

## 2021-02-18 ENCOUNTER — Other Ambulatory Visit: Payer: Self-pay

## 2021-02-18 ENCOUNTER — Ambulatory Visit (INDEPENDENT_AMBULATORY_CARE_PROVIDER_SITE_OTHER): Payer: Medicare HMO

## 2021-02-18 DIAGNOSIS — S92505A Nondisplaced unspecified fracture of left lesser toe(s), initial encounter for closed fracture: Secondary | ICD-10-CM

## 2021-02-18 MED ORDER — GABAPENTIN 100 MG PO CAPS: 100.0000 mg | ORAL_CAPSULE | Freq: Three times a day (TID) | ORAL | 0 refills | Status: AC

## 2021-02-18 NOTE — Progress Notes (Signed)
   HPI: 65 y.o. female presenting today for follow-up evaluation of bilateral foot pain and a stubbing injury to the left foot.  Patient states that most recently she also stubbed her right foot.  Patient's main complaint today is the numbness that she experiences to the toes 1-5 of the right foot.  Very aggravating and symptomatic both with and without activity or with and without shoes.  No past medical history on file.   Physical Exam: General: The patient is alert and oriented x3 in no acute distress.  Dermatology: No open skin lacerations noted.  Vascular: Palpable pedal pulses bilaterally.    Neurological: Epicritic and protective threshold grossly intact bilaterally.  Patient does have some loss of sensation with numbness to the right forefoot especially the toes both surgical and nonsurgical toes  Musculoskeletal Exam: History of right great toe arthroplasty with implant.  Hammertoe correction fourth bilateral.  Fifth right.  DOS 09/19/2020.  The fourth and fifth toes in good alignment.  There is some adductovarus curvature of the fifth toe left foot.  Negative for any significant pain on palpation throughout the foot  Radiographic Exam:  Osteotomy sites are stable.  The right great toe Silastic implant appears stable with no significant change compared to prior x-rays.  The small avulsion fracture from the base of the fifth proximal phalanx appears mostly resolved.  Patient is asymptomatic clinically to these areas  Assessment: 1.  Fracture fifth toe left foot secondary to injury.  DOI: 12/15/2020 2. Peripheral neuropathy/neuritis RT forefoot   Plan of Care:  1. Patient evaluated.  X-rays reviewed 2.  Reassured the patient that surgically the feet bilaterally are doing well. 3.  Patient complains of numbness to the entire right forefoot both surgical and nonsurgical toes.  She says it is very aggravating especially at nighttime when she goes to bed 4.  Prescription for gabapentin  100 mg 3 times daily #90 5.  Continue wearing good supportive shoes with arch supports to offload pressure from the forefoot 6.  Return to clinic as needed     Felecia Shelling, DPM [facility]  Dr. Felecia Shelling, DPM    [address] [place], [place] [address]                Office [phone]  Fax [phone]

## 2021-04-30 ENCOUNTER — Other Ambulatory Visit: Payer: Self-pay | Admitting: Internal Medicine

## 2021-04-30 DIAGNOSIS — Z1231 Encounter for screening mammogram for malignant neoplasm of breast: Secondary | ICD-10-CM

## 2021-05-29 ENCOUNTER — Other Ambulatory Visit: Payer: Self-pay

## 2021-05-29 ENCOUNTER — Ambulatory Visit
Admission: RE | Admit: 2021-05-29 | Discharge: 2021-05-29 | Disposition: A | Discharge status: Home / Self Care | Payer: Medicare HMO | Source: Ambulatory Visit | Attending: Internal Medicine | Admitting: Internal Medicine

## 2021-05-29 DIAGNOSIS — Z1231 Encounter for screening mammogram for malignant neoplasm of breast: Secondary | ICD-10-CM | POA: Insufficient documentation

## 2021-06-25 ENCOUNTER — Other Ambulatory Visit: Payer: Self-pay

## 2022-01-01 DIAGNOSIS — E119 Type 2 diabetes mellitus without complications: Secondary | ICD-10-CM | POA: Diagnosis not present

## 2022-01-01 DIAGNOSIS — E781 Pure hyperglyceridemia: Secondary | ICD-10-CM | POA: Diagnosis not present

## 2022-01-01 DIAGNOSIS — I10 Essential (primary) hypertension: Secondary | ICD-10-CM | POA: Diagnosis not present

## 2022-01-01 DIAGNOSIS — F1911 Other psychoactive substance abuse, in remission: Secondary | ICD-10-CM | POA: Diagnosis not present

## 2022-01-01 DIAGNOSIS — F325 Major depressive disorder, single episode, in full remission: Secondary | ICD-10-CM | POA: Diagnosis not present

## 2022-01-01 DIAGNOSIS — E890 Postprocedural hypothyroidism: Secondary | ICD-10-CM | POA: Diagnosis not present

## 2022-02-16 DIAGNOSIS — M79671 Pain in right foot: Secondary | ICD-10-CM | POA: Diagnosis not present

## 2022-02-16 DIAGNOSIS — M7741 Metatarsalgia, right foot: Secondary | ICD-10-CM | POA: Diagnosis not present

## 2022-03-16 DIAGNOSIS — M7741 Metatarsalgia, right foot: Secondary | ICD-10-CM | POA: Diagnosis not present

## 2022-03-16 DIAGNOSIS — M67471 Ganglion, right ankle and foot: Secondary | ICD-10-CM | POA: Diagnosis not present

## 2022-03-23 DIAGNOSIS — Z8669 Personal history of other diseases of the nervous system and sense organs: Secondary | ICD-10-CM | POA: Diagnosis not present

## 2022-03-23 DIAGNOSIS — H524 Presbyopia: Secondary | ICD-10-CM | POA: Diagnosis not present

## 2022-03-23 DIAGNOSIS — H5203 Hypermetropia, bilateral: Secondary | ICD-10-CM | POA: Diagnosis not present

## 2022-03-23 DIAGNOSIS — H25013 Cortical age-related cataract, bilateral: Secondary | ICD-10-CM | POA: Diagnosis not present

## 2022-03-23 DIAGNOSIS — Z135 Encounter for screening for eye and ear disorders: Secondary | ICD-10-CM | POA: Diagnosis not present

## 2022-03-23 DIAGNOSIS — E119 Type 2 diabetes mellitus without complications: Secondary | ICD-10-CM | POA: Diagnosis not present

## 2022-03-23 DIAGNOSIS — H52223 Regular astigmatism, bilateral: Secondary | ICD-10-CM | POA: Diagnosis not present

## 2022-03-23 DIAGNOSIS — Z7984 Long term (current) use of oral hypoglycemic drugs: Secondary | ICD-10-CM | POA: Diagnosis not present

## 2022-03-23 DIAGNOSIS — H2513 Age-related nuclear cataract, bilateral: Secondary | ICD-10-CM | POA: Diagnosis not present

## 2022-03-30 DIAGNOSIS — D2271 Melanocytic nevi of right lower limb, including hip: Secondary | ICD-10-CM | POA: Diagnosis not present

## 2022-03-30 DIAGNOSIS — D2261 Melanocytic nevi of right upper limb, including shoulder: Secondary | ICD-10-CM | POA: Diagnosis not present

## 2022-03-30 DIAGNOSIS — Z85828 Personal history of other malignant neoplasm of skin: Secondary | ICD-10-CM | POA: Diagnosis not present

## 2022-03-30 DIAGNOSIS — X32XXXA Exposure to sunlight, initial encounter: Secondary | ICD-10-CM | POA: Diagnosis not present

## 2022-03-30 DIAGNOSIS — D2262 Melanocytic nevi of left upper limb, including shoulder: Secondary | ICD-10-CM | POA: Diagnosis not present

## 2022-03-30 DIAGNOSIS — C44212 Basal cell carcinoma of skin of right ear and external auricular canal: Secondary | ICD-10-CM | POA: Diagnosis not present

## 2022-03-30 DIAGNOSIS — L570 Actinic keratosis: Secondary | ICD-10-CM | POA: Diagnosis not present

## 2022-03-30 DIAGNOSIS — D485 Neoplasm of uncertain behavior of skin: Secondary | ICD-10-CM | POA: Diagnosis not present

## 2022-03-31 DIAGNOSIS — M71371 Other bursal cyst, right ankle and foot: Secondary | ICD-10-CM | POA: Diagnosis not present

## 2022-03-31 DIAGNOSIS — M67471 Ganglion, right ankle and foot: Secondary | ICD-10-CM | POA: Diagnosis not present

## 2022-03-31 DIAGNOSIS — M25374 Other instability, right foot: Secondary | ICD-10-CM | POA: Diagnosis not present

## 2022-03-31 DIAGNOSIS — M7741 Metatarsalgia, right foot: Secondary | ICD-10-CM | POA: Diagnosis not present

## 2022-03-31 DIAGNOSIS — G8918 Other acute postprocedural pain: Secondary | ICD-10-CM | POA: Diagnosis not present

## 2022-03-31 DIAGNOSIS — M2041 Other hammer toe(s) (acquired), right foot: Secondary | ICD-10-CM | POA: Diagnosis not present

## 2022-04-27 ENCOUNTER — Other Ambulatory Visit: Payer: Self-pay | Admitting: Internal Medicine

## 2022-04-27 DIAGNOSIS — Z1231 Encounter for screening mammogram for malignant neoplasm of breast: Secondary | ICD-10-CM

## 2022-05-12 DIAGNOSIS — E781 Pure hyperglyceridemia: Secondary | ICD-10-CM | POA: Diagnosis not present

## 2022-05-12 DIAGNOSIS — E119 Type 2 diabetes mellitus without complications: Secondary | ICD-10-CM | POA: Diagnosis not present

## 2022-05-18 DIAGNOSIS — Z4789 Encounter for other orthopedic aftercare: Secondary | ICD-10-CM | POA: Diagnosis not present

## 2022-05-18 DIAGNOSIS — M67471 Ganglion, right ankle and foot: Secondary | ICD-10-CM | POA: Diagnosis not present

## 2022-05-19 DIAGNOSIS — Z0000 Encounter for general adult medical examination without abnormal findings: Secondary | ICD-10-CM | POA: Diagnosis not present

## 2022-05-19 DIAGNOSIS — E890 Postprocedural hypothyroidism: Secondary | ICD-10-CM | POA: Diagnosis not present

## 2022-05-19 DIAGNOSIS — F325 Major depressive disorder, single episode, in full remission: Secondary | ICD-10-CM | POA: Diagnosis not present

## 2022-05-19 DIAGNOSIS — E781 Pure hyperglyceridemia: Secondary | ICD-10-CM | POA: Diagnosis not present

## 2022-05-19 DIAGNOSIS — E119 Type 2 diabetes mellitus without complications: Secondary | ICD-10-CM | POA: Diagnosis not present

## 2022-05-19 DIAGNOSIS — Z791 Long term (current) use of non-steroidal anti-inflammatories (NSAID): Secondary | ICD-10-CM | POA: Diagnosis not present

## 2022-05-19 DIAGNOSIS — I10 Essential (primary) hypertension: Secondary | ICD-10-CM | POA: Diagnosis not present

## 2022-06-01 DIAGNOSIS — C44212 Basal cell carcinoma of skin of right ear and external auricular canal: Secondary | ICD-10-CM | POA: Diagnosis not present

## 2022-06-02 ENCOUNTER — Ambulatory Visit
Admission: RE | Admit: 2022-06-02 | Discharge: 2022-06-02 | Disposition: A | Discharge status: Home / Self Care | Payer: PPO | Source: Ambulatory Visit | Attending: Internal Medicine | Admitting: Internal Medicine

## 2022-06-02 DIAGNOSIS — Z1231 Encounter for screening mammogram for malignant neoplasm of breast: Secondary | ICD-10-CM | POA: Insufficient documentation

## 2022-06-17 DIAGNOSIS — M7741 Metatarsalgia, right foot: Secondary | ICD-10-CM | POA: Diagnosis not present

## 2022-06-17 DIAGNOSIS — Z4789 Encounter for other orthopedic aftercare: Secondary | ICD-10-CM | POA: Diagnosis not present

## 2023-02-17 ENCOUNTER — Ambulatory Visit: Payer: Medicare HMO | Attending: Internal Medicine

## 2023-02-17 ENCOUNTER — Other Ambulatory Visit: Payer: Self-pay

## 2023-02-17 DIAGNOSIS — R293 Abnormal posture: Secondary | ICD-10-CM | POA: Insufficient documentation

## 2023-02-17 DIAGNOSIS — N393 Stress incontinence (female) (male): Secondary | ICD-10-CM | POA: Diagnosis present

## 2023-02-17 DIAGNOSIS — R2689 Other abnormalities of gait and mobility: Secondary | ICD-10-CM | POA: Insufficient documentation

## 2023-02-17 DIAGNOSIS — M6281 Muscle weakness (generalized): Secondary | ICD-10-CM | POA: Insufficient documentation

## 2023-02-17 NOTE — Patient Instructions (Signed)
TOILET POSTURE: Urination: feet flat, lean forward with forearms on legs to fully empty bladder. Bowel movement: place feet flat on Squatty Potty or stool so knees are higher than hips, lean forward to relax pelvic floor in order to avoid strain.  Water: drink room-temperature water (8 oz.) first thing in the morning.  Posture: try not cross legs at ankles or knees.  Shoes: try to wear supportive shoes with heel straps vs. Slides.

## 2023-02-17 NOTE — Therapy (Signed)
OUTPATIENT PHYSICAL THERAPY FEMALE PELVIC EVALUATION   Patient Name: Nicole Pruitt MRN: [id number] DOB:[birth year], 67 y.o., female Today's Date: 02/17/2023  END OF SESSION:  PT End of Session - 02/17/23 1114     Visit Number 1    Number of Visits 9    Date for PT Re-Evaluation 04/18/23    Authorization Type [employer]    Progress Note Due on Visit 10    PT Start Time 1104    PT Stop Time 1145    PT Time Calculation (min) 41 min    Activity Tolerance Patient tolerated treatment well    Behavior During Therapy [facility] for tasks assessed/performed             History reviewed. No pertinent past medical history. Past Surgical History:  Procedure Laterality Date   AUGMENTATION MAMMAPLASTY Bilateral 1991   removed in 1993   BREAST IMPLANT REMOVAL Bilateral 1994   breast lift done at same time   Patient Active Problem List   Diagnosis Date Noted   History of substance abuse (HCC) 08/08/2020   Depression, major, in remission (HCC) 10/15/2015   Arthritis, degenerative 07/02/2015   H/O hyperthyroidism 07/02/2015   Blood glucose elevated 07/02/2015   Hypertriglyceridemia 07/02/2015   Adult hypothyroidism 07/02/2015   Abuse, drug or alcohol (HCC) 07/02/2015   Essential (primary) hypertension 03/16/2014    PCP: Lynnea Ferrier, MD  REFERRING PROVIDER: Lynnea Ferrier, MD  REFERRING DIAG: other female prolapse  THERAPY DIAG:  Muscle weakness (generalized)  Other abnormalities of gait and mobility  Abnormal posture  SUI (stress urinary incontinence, female)  Rationale for Evaluation and Treatment: Rehabilitation  ONSET DATE: 11/27/22 referral date  SUBJECTIVE:                                                                                                                                                                                           SUBJECTIVE STATEMENT: Pt cares for her autistic 44 y/o granddtr and is a retired Teacher, adult education.  Urinary function:  Pt reported she has hx of leakage about 30 years ago postpartum and did biofeedback and it helped. Now SUI began after foot/hand surgery as she was sedentary for 1-2 years. She used to walk 3 miles/day and swim but hasn't lately. Pt voids approx. Once/four hours. Pt has nocturia: once or twice a night-BP meds make her void at night. SUI occurs with laughing, coughing and sneezing, she also has urge incontinence. Pt does not wear pantyliner or pads, she rarely has to change underwear 2/2 leakage.   Bowel function: not daily, every 2-3 days. Ozempic has caused some gut  issues-painful 2/2 constipation. Pt takes metamucil prn. Pt has hx of hemorrhoids.   Core stability: no hx of abdominal surgeries, with hx of LBP 2/2 DDD.   Sexual function: pt denied difficulty climaxing and no hx of pain with intercourse, OBGYN or tampon insertion. Not sexually active as pt is divorced.   Fluid intake: Yes: pt has 2 cups of coffee, then one thermos of ice/diet soda, then drinks water throughout day, decaf coffee after dinner, pt used to drink a lot more water when active    PAIN:  Are you having pain? No but pt does have hx of LBP with certain activity (bending forward after gardening, laying down flat at night), standing decr. Pain or repositioning. Hx of DDD.  PRECAUTIONS: Other: DDD  RED FLAGS: None   WEIGHT BEARING RESTRICTIONS: No  FALLS:  Has patient fallen in last 6 months? No  LIVING ENVIRONMENT: Lives with: lives with their family and lives with their daughter and granddtr. Lives in: House/apartment Stairs: Yes: External: 3-4 steps; on right going up Has following equipment at home: None  OCCUPATION: retired but Curator  PLOF: Independent  PATIENT GOALS: Not to have to wear a stinking pad. To enjoy life without worrying leaking.  PERTINENT HISTORY:  DDD (degenerative disc disease)  cervical spine surgery approximately 1992  DJD (degenerative joint disease)  bilateral knees   History of hyperthyroidism  s/p I-131 treatment  Hyperglycemia  Hypertriglyceridemia  Hypothyroidism  Unspecified essential hypertension   Past Surgical History:  Diverticulosis/Otherwise normal/Repeat 7yrs/TKT  Breast augmentation and subsequent implantation removal  CHOLECYSTECTOMY  Foot surgery   Sexual abuse: No  BOWEL MOVEMENT: Pain with bowel movement: No Type of bowel movement:Frequency every 2-3 days Fully empty rectum: Yes:   Leakage: No Pads: No Fiber supplement: Yes: prn  URINATION: Pain with urination: No Fully empty bladder: No Stream: Strong Urgency: Yes: when walking to bathroom Frequency: every four hours Leakage: Walking to the bathroom, Coughing, Sneezing, Laughing, and Bending forward Pads: No  INTERCOURSE: Pain with intercourse: not sexually active with no hx of pain during intercourse.  PREGNANCY: Vaginal deliveries 2 Tearing No but complications with placenta not wanting to deliver. C-section deliveries 0 Currently pregnant No  PROLAPSE: Cystocele pt was told mild    OBJECTIVE:   COGNITION: Overall cognitive status: Within functional limits for tasks assessed     SENSATION: Light touch: Appears intact Proprioception: Appears intact   GAIT: Distance walked: 75 Assistive device utilized: None Level of assistance: Complete Independence Comments: decr. Stride length and trunk rotation  POSTURE: increased lumbar lordosis and decreased thoracic kyphosis  PELVIC ALIGNMENT: post. Pelvic tilt  LUMBARAROM/PROM: not performed 2/2 time constraints  A/PROM A/PROM  eval  Flexion   Extension   Right lateral flexion   Left lateral flexion   Right rotation   Left rotation    (Blank rows = not tested)  LOWER EXTREMITY ROM:  Active ROM Right eval Left eval  Hip flexion    Hip extension    Hip abduction    Hip adduction    Hip internal rotation    Hip external rotation    Knee flexion    Knee extension    Ankle  dorsiflexion    Ankle plantarflexion    Ankle inversion    Ankle eversion     (Blank rows = not tested)  LOWER EXTREMITY MMT:  MMT Right eval Left eval  Hip flexion    Hip extension    Hip abduction  Hip adduction    Hip internal rotation    Hip external rotation    Knee flexion    Knee extension    Ankle dorsiflexion    Ankle plantarflexion    Ankle inversion    Ankle eversion     PALPATION: Not performed 2/2 time constraints   PELVIC MMT:   MMT eval  Vaginal   Internal Anal Sphincter   External Anal Sphincter   Puborectalis   Diastasis Recti   (Blank rows = not tested)        TONE: Not performed 2/2 time constraints  PROLAPSE: Not performed 2/2 time constraints  TODAY'S TREATMENT:                                                                                                                              DATE: 02/17/23  EVAL    PATIENT EDUCATION:  Education details: PT educated pt on POC, frequency and duration. PT educated pt on toileting posture, overall posture, water intake, and proper shoes. Person educated: Patient Education method: Explanation, Demonstration, and Handouts Education comprehension: verbalized understanding, returned demonstration, and needs further education  HOME EXERCISE PROGRAM: See above for activities, medbridge not yet established.  ASSESSMENT:  CLINICAL IMPRESSION: Patient is a pleasant 67 y.o. female who was seen today for physical therapy evaluation and treatment for prolapse and mixed urinary incontinence. Pt's PMH is significant for the following:  DDD (degenerative disc disease), cervical spine surgery approximately 1992 , DJD (degenerative joint disease) bilateral knees, History of hyperthyroidism s/p I-131 treatment , Hyperglycemia , Hypertriglyceridemia , Hypothyroidism , Unspecified essential hypertension , Diverticulosis/Otherwise normal/Repeat 35yrs/TKT , Breast augmentation and subsequent implantation removal ,  CHOLECYSTECTOMY, R Foot surgery  and R hand reconstruction 2/2 lawn mowing accident. The following impairments were noted upon exam: postural dysfunction, gait deviation, urinary incontinence, LBP, muscle weakness likely based on gait and subjective reports, decr. ROM, impaired SLS balance. Pt would benefit from skilled PT to improve safety and decr. Leakage during all ADLs.   OBJECTIVE IMPAIRMENTS: Abnormal gait, decreased balance, decreased coordination, decreased endurance, decreased mobility, decreased ROM, decreased strength, hypomobility, increased fascial restrictions, impaired flexibility, postural dysfunction, and pain.   ACTIVITY LIMITATIONS: bending, standing, sleeping, transfers, continence, toileting, and caring for others  PARTICIPATION LIMITATIONS: cleaning, laundry, interpersonal relationship, community activity, and yard work  PERSONAL FACTORS: Age, Past/current experiences, Time since onset of injury/illness/exacerbation, and 3+ comorbidities: see above  are also affecting patient's functional outcome.   REHAB POTENTIAL: Good  CLINICAL DECISION MAKING: Stable/uncomplicated  EVALUATION COMPLEXITY: Low   GOALS: Goals reviewed with patient? Yes  SHORT TERM GOALS: Target date: for all STGs: 03/17/23  Pt will be IND in HEP to improve strength, posture, balance and coordination. Baseline: no HEP Goal status: INITIAL  2.  Complete exam (DR, palpation, MMT) and write goals as indicated. Baseline: limited 2/2 time constraints Goal status: INITIAL  3.  Pt will demo proper toileting technique to fully empty bladder  and rectum. Baseline: unable to demo Goal status: INITIAL  4.  Pt will complete FOTO and write goal as indicated. Baseline:  Goal status: INITIAL   LONG TERM GOALS: Target date: for all LTGs: 04/14/23  Pt will demonstrate proper coordination of PFM contraction and relaxation with breath to not leak while sneezing. Baseline: leaks with sneezing Goal status:  INITIAL  2.  Pt will demonstrate proper coordination of breath and PFM contraction (and fully empty bladder) to not incr. IAP and leakage while bending forward to pull up pants after voiding. Baseline: leaks when pulling up pants after voiding Goal status: INITIAL  3.  Pt will report return to walking/swimming and strength training to maintain gains made during PT. Baseline: unable to perform at this time. Goal status: INITIAL   PLAN: see below  PT FREQUENCY: 1x/week  PT DURATION: 8 weeks  PLANNED INTERVENTIONS: Therapeutic exercises, Therapeutic activity, Neuromuscular re-education, Balance training, Gait training, Patient/Family education, Self Care, Joint mobilization, Joint manipulation, Dry Needling, Spinal manipulation, Spinal mobilization, Moist heat, scar mobilization, Biofeedback, Manual therapy, and Re-evaluation  PLAN FOR NEXT SESSION: complete exam (palpation, MMT, DR), review posture and provide mindfulness techniques.   Adena Sima L, PT 02/17/2023, 11:14 AM  Zerita Boers, PT,DPT 02/17/23 11:14 AM Phone: [phone] Fax: [phone]

## 2023-03-03 ENCOUNTER — Ambulatory Visit: Payer: Medicare HMO

## 2023-03-10 ENCOUNTER — Ambulatory Visit: Payer: Medicare HMO | Attending: Internal Medicine

## 2023-03-10 DIAGNOSIS — R2689 Other abnormalities of gait and mobility: Secondary | ICD-10-CM | POA: Diagnosis present

## 2023-03-10 DIAGNOSIS — N393 Stress incontinence (female) (male): Secondary | ICD-10-CM | POA: Insufficient documentation

## 2023-03-10 DIAGNOSIS — M6281 Muscle weakness (generalized): Secondary | ICD-10-CM | POA: Insufficient documentation

## 2023-03-10 DIAGNOSIS — R293 Abnormal posture: Secondary | ICD-10-CM | POA: Insufficient documentation

## 2023-03-10 NOTE — Therapy (Signed)
OUTPATIENT PHYSICAL THERAPY FEMALE PELVIC TREATMENT   Patient Name: Nicole Pruitt MRN: [id number] DOB:[birth year], 67 y.o., female Today's Date: 03/10/2023  END OF SESSION:  PT End of Session - 03/10/23 1019     Visit Number 2    Number of Visits 9    Date for PT Re-Evaluation 04/18/23    Authorization Type [employer] Medicare    Progress Note Due on Visit 10    PT Start Time 1016    PT Stop Time 1100    PT Time Calculation (min) 44 min    Activity Tolerance Patient tolerated treatment well    Behavior During Therapy [facility] for tasks assessed/performed             History reviewed. No pertinent past medical history. Past Surgical History:  Procedure Laterality Date   AUGMENTATION MAMMAPLASTY Bilateral 1991   removed in 1993   BREAST IMPLANT REMOVAL Bilateral 1994   breast lift done at same time   Patient Active Problem List   Diagnosis Date Noted   History of substance abuse (HCC) 08/08/2020   Depression, major, in remission (HCC) 10/15/2015   Arthritis, degenerative 07/02/2015   H/O hyperthyroidism 07/02/2015   Blood glucose elevated 07/02/2015   Hypertriglyceridemia 07/02/2015   Adult hypothyroidism 07/02/2015   Abuse, drug or alcohol (HCC) 07/02/2015   Essential (primary) hypertension 03/16/2014    PCP: Lynnea Ferrier, MD  REFERRING PROVIDER: Lynnea Ferrier, MD  REFERRING DIAG: other female prolapse  THERAPY DIAG:  Muscle weakness (generalized)  SUI (stress urinary incontinence, female)  Abnormal posture  Other abnormalities of gait and mobility  Rationale for Evaluation and Treatment: Rehabilitation  ONSET DATE: 11/27/22 referral date  SUBJECTIVE:                                                                                                                                                                                           SUBJECTIVE STATEMENT:  Pt missed several weeks 2/2 being sick from Ozempic and PNA vaccine. She stopped Ozempic. Pt has  been practicing toileting posture and self-corrected seated posture from crossing LEs and performed figure-four stretch. Her dtr had a hysterectomy last week and is assisting her. She's very busy taking care of her family. Pt has been drinking water first thing in the morning occasionally but has reduced coffee 2/2 gastric upset.   PAIN:  Are you having pain? No but pt does have hx of LBP with certain activity (bending forward after gardening, laying down flat at night), standing decr. Pain or repositioning. Hx of DDD.  PRECAUTIONS: Other: DDD  RED FLAGS: None   WEIGHT BEARING RESTRICTIONS:  No  FALLS:  Has patient fallen in last 6 months? No  LIVING ENVIRONMENT: Lives with: lives with their family and lives with their daughter and granddtr. Lives in: House/apartment Stairs: Yes: External: 3-4 steps; on right going up Has following equipment at home: None  OCCUPATION: retired but Curator  PLOF: Independent  PATIENT GOALS: Not to have to wear a stinking pad. To enjoy life without worrying leaking.  PERTINENT HISTORY:  DDD (degenerative disc disease)  cervical spine surgery approximately 1992  DJD (degenerative joint disease)  bilateral knees  History of hyperthyroidism  s/p I-131 treatment  Hyperglycemia  Hypertriglyceridemia  Hypothyroidism  Unspecified essential hypertension   Past Surgical History:  Diverticulosis/Otherwise normal/Repeat 29yrs/TKT  Breast augmentation and subsequent implantation removal  CHOLECYSTECTOMY  Foot surgery   Sexual abuse: No  BOWEL MOVEMENT: Pain with bowel movement: No Type of bowel movement:Frequency every 2-3 days Fully empty rectum: Yes:   Leakage: No Pads: No Fiber supplement: Yes: prn  URINATION: Pain with urination: No Fully empty bladder: No Stream: Strong Urgency: Yes: when walking to bathroom Frequency: every four hours Leakage: Walking to the bathroom, Coughing, Sneezing, Laughing, and Bending forward Pads:  No  INTERCOURSE: Pain with intercourse: not sexually active with no hx of pain during intercourse.  PREGNANCY: Vaginal deliveries 2 Tearing No but complications with placenta not wanting to deliver. C-section deliveries 0 Currently pregnant No  PROLAPSE: Cystocele pt was told mild    OBJECTIVE:   COGNITION: Overall cognitive status: Within functional limits for tasks assessed     SENSATION: Light touch: Appears intact Proprioception: Appears intact   GAIT: Distance walked: 75 Assistive device utilized: None Level of assistance: Complete Independence Comments: decr. Stride length and trunk rotation  POSTURE: increased lumbar lordosis and decreased thoracic kyphosis  PELVIC ALIGNMENT: post. Pelvic tilt  LUMBARAROM/PROM:   A/PROM A/PROM  eval  Flexion WFL  Extension WFL with chronic LBP  Right lateral flexion WFL  Left lateral flexion WFL  Right rotation WFL  Left rotation WFL   (Blank rows = not tested)  LOWER EXTREMITY ROM: all WFL except B hip IR.  Active ROM Right eval Left eval  Hip flexion    Hip extension    Hip abduction    Hip adduction    Hip internal rotation    Hip external rotation    Knee flexion    Knee extension    Ankle dorsiflexion    Ankle plantarflexion    Ankle inversion    Ankle eversion     (Blank rows = not tested)  LOWER EXTREMITY MMT:  MMT Right eval Left eval  Hip flexion 5/5 5/5  Hip extension    Hip abduction    Hip adduction    Hip internal rotation 4/5 4/5  Hip external rotation 4/5 4/5  Knee flexion 4/5 4/5  Knee extension 5/5 5/5  Ankle dorsiflexion 5/5 5/5  Ankle plantarflexion    Ankle inversion    Ankle eversion     PALPATION: see below    PELVIC MMT: see below   MMT eval  Vaginal   Internal Anal Sphincter   External Anal Sphincter   Puborectalis   Diastasis Recti   (Blank rows = not tested)          TODAY'S TREATMENT:  DATE: 03/10/23   PT completed exam: no diastasis noted, incr. Hypomobility of tx spine, no TTP over glutes or hip rotators. Decr. B hip IR. Pt had difficulty coordinating breath with PFM contraction and relaxation.  NMR: Access Code: [email or web address] URL: [email or web address] Date: 03/10/2023 Prepared by: Zerita Boers  Exercises - Sidelying Open Book Thoracic Lumbar Rotation and Extension  - 1 x daily - 7 x weekly - 1 sets - 10 reps - 2 hold - Supine Angels  - 1 x daily - 7 x weekly - 1 sets - 10 reps - Sidelying Diaphragmatic Breathing  - 1 x daily - 7 x weekly - 1 sets - 5 reps -Supine diaphragmatic breathing - Standing tx spine ext (center, L and R sides)  - 1 x daily - 7 x weekly - 1 sets - 1 reps - 30 hold  Cues and demo for proper technique.   PATIENT EDUCATION:  Education details: PT educated pt on IAP and impact on PFM (leakage, prolapse, pain). PT educated pt on HEP and importance of improving ROM and breath work prior to strengthening.  Person educated: Patient Education method: Explanation, Demonstration, and Handouts Education comprehension: verbalized understanding, returned demonstration, and needs further education  HOME EXERCISE PROGRAM: See above for activities, medbridge not yet established.  ASSESSMENT:  CLINICAL IMPRESSION: Today's skilled session focused on completing exam and establishing HEP to improve ROM and breath work to improve IAP and decr. Pressure on PFM. The following were noted:  no diastasis noted, incr. Hypomobility of tx spine, decr. Strength, R concave curve of Tx spine which decr. Trunk rotation, no TTP over glutes or hip rotators. Decr. B hip IR. Pt had difficulty coordinating breath with PFM contraction and relaxation.The following impairments continue to be noted: postural dysfunction, gait deviation, urinary incontinence, LBP, decr. ROM, impaired SLS balance. Pt would continue  to benefit from skilled PT to improve safety and decr. Leakage during all ADLs. All goals will be pushed out two weeks 2/2 pt missing appt's 2/2 illness.   OBJECTIVE IMPAIRMENTS: Abnormal gait, decreased balance, decreased coordination, decreased endurance, decreased mobility, decreased ROM, decreased strength, hypomobility, increased fascial restrictions, impaired flexibility, postural dysfunction, and pain.   ACTIVITY LIMITATIONS: bending, standing, sleeping, transfers, continence, toileting, and caring for others  PARTICIPATION LIMITATIONS: cleaning, laundry, interpersonal relationship, community activity, and yard work  PERSONAL FACTORS: Age, Past/current experiences, Time since onset of injury/illness/exacerbation, and 3+ comorbidities: see above  are also affecting patient's functional outcome.   REHAB POTENTIAL: Good  CLINICAL DECISION MAKING: Stable/uncomplicated  EVALUATION COMPLEXITY: Low   GOALS: Goals reviewed with patient? Yes  SHORT TERM GOALS: Target date: for all STGs: 03/17/23 NEW: 03/31/23  Pt will be IND in HEP to improve strength, posture, balance and coordination. Baseline: no HEP Goal status: INITIAL  2.  Complete exam (DR, palpation, MMT) and write goals as indicated. Baseline: limited 2/2 time constraints Goal status: INITIAL  3.  Pt will demo proper toileting technique to fully empty bladder and rectum. Baseline: unable to demo Goal status: INITIAL  4.  Pt will complete FOTO and write goal as indicated. Baseline:  Goal status: INITIAL   LONG TERM GOALS: Target date: for all LTGs: 04/14/23 NEW: 04/28/23  Pt will demonstrate proper coordination of PFM contraction and relaxation with breath to not leak while sneezing. Baseline: leaks with sneezing Goal status: INITIAL  2.  Pt will demonstrate proper coordination of breath and PFM contraction (and fully empty bladder) to not incr. IAP and leakage while bending forward  to pull up pants after  voiding. Baseline: leaks when pulling up pants after voiding Goal status: INITIAL  3.  Pt will report return to walking/swimming and strength training to maintain gains made during PT. Baseline: unable to perform at this time. Goal status: INITIAL   PLAN: see below  PT FREQUENCY: 1x/week  PT DURATION: 8 weeks  PLANNED INTERVENTIONS: Therapeutic exercises, Therapeutic activity, Neuromuscular re-education, Balance training, Gait training, Patient/Family education, Self Care, Joint mobilization, Joint manipulation, Dry Needling, Spinal manipulation, Spinal mobilization, Moist heat, scar mobilization, Biofeedback, Manual therapy, and Re-evaluation  PLAN FOR NEXT SESSION: review HEP and provide mindfulness techniques.   Governor Matos L, PT 03/10/2023, 10:19 AM  Zerita Boers, PT,DPT 03/10/23 10:19 AM Phone: [phone] Fax: [phone]

## 2023-03-16 ENCOUNTER — Ambulatory Visit: Payer: Medicare HMO

## 2023-03-17 ENCOUNTER — Ambulatory Visit: Payer: Medicare HMO

## 2023-03-17 ENCOUNTER — Other Ambulatory Visit: Payer: Self-pay

## 2023-03-17 DIAGNOSIS — R2689 Other abnormalities of gait and mobility: Secondary | ICD-10-CM

## 2023-03-17 DIAGNOSIS — M6281 Muscle weakness (generalized): Secondary | ICD-10-CM

## 2023-03-17 DIAGNOSIS — R293 Abnormal posture: Secondary | ICD-10-CM

## 2023-03-17 DIAGNOSIS — N393 Stress incontinence (female) (male): Secondary | ICD-10-CM

## 2023-04-07 ENCOUNTER — Ambulatory Visit: Payer: Medicare HMO

## 2023-04-14 ENCOUNTER — Ambulatory Visit: Payer: Medicare HMO

## 2023-04-14 ENCOUNTER — Other Ambulatory Visit (HOSPITAL_COMMUNITY): Payer: Self-pay | Admitting: Orthopedic Surgery

## 2023-05-05 ENCOUNTER — Encounter: Payer: Medicare HMO | Admitting: Physical Therapy

## 2023-05-12 ENCOUNTER — Encounter: Payer: Medicare HMO | Admitting: Physical Therapy

## 2023-05-17 ENCOUNTER — Other Ambulatory Visit: Payer: Self-pay | Admitting: Internal Medicine

## 2023-05-17 DIAGNOSIS — Z1231 Encounter for screening mammogram for malignant neoplasm of breast: Secondary | ICD-10-CM

## 2023-05-19 ENCOUNTER — Encounter: Payer: Medicare HMO | Admitting: Physical Therapy

## 2023-06-09 ENCOUNTER — Ambulatory Visit
Admission: RE | Admit: 2023-06-09 | Discharge: 2023-06-09 | Disposition: A | Discharge status: Home / Self Care | Payer: Medicare HMO | Source: Ambulatory Visit | Attending: Internal Medicine | Admitting: Internal Medicine

## 2023-06-09 DIAGNOSIS — Z1231 Encounter for screening mammogram for malignant neoplasm of breast: Secondary | ICD-10-CM | POA: Insufficient documentation

## 2023-06-24 ENCOUNTER — Encounter (HOSPITAL_BASED_OUTPATIENT_CLINIC_OR_DEPARTMENT_OTHER): Payer: Self-pay

## 2023-06-24 ENCOUNTER — Ambulatory Visit (HOSPITAL_BASED_OUTPATIENT_CLINIC_OR_DEPARTMENT_OTHER): Admit: 2023-06-24 | Payer: Medicare HMO | Admitting: Orthopedic Surgery

## 2023-06-24 SURGERY — HAMMERTOE RECONSTRUCTION WITH WEIL OSTEOTOMY
Anesthesia: General | Laterality: Right

## 2023-08-09 IMAGING — MG MM DIGITAL SCREENING BILAT W/ TOMO AND CAD
8 series · 8 of 24 positions shown · non-contrast
Comparison: Previous exam(s).

CLINICAL DATA: Screening.

EXAM:
DIGITAL SCREENING BILATERAL MAMMOGRAM WITH TOMOSYNTHESIS AND CAD
TECHNIQUE: Bilateral screening digital craniocaudal and mediolateral oblique
mammograms were obtained. Bilateral screening digital breast
tomosynthesis was performed. The images were evaluated with
computer-aided detection.

[L CC synth-2D]
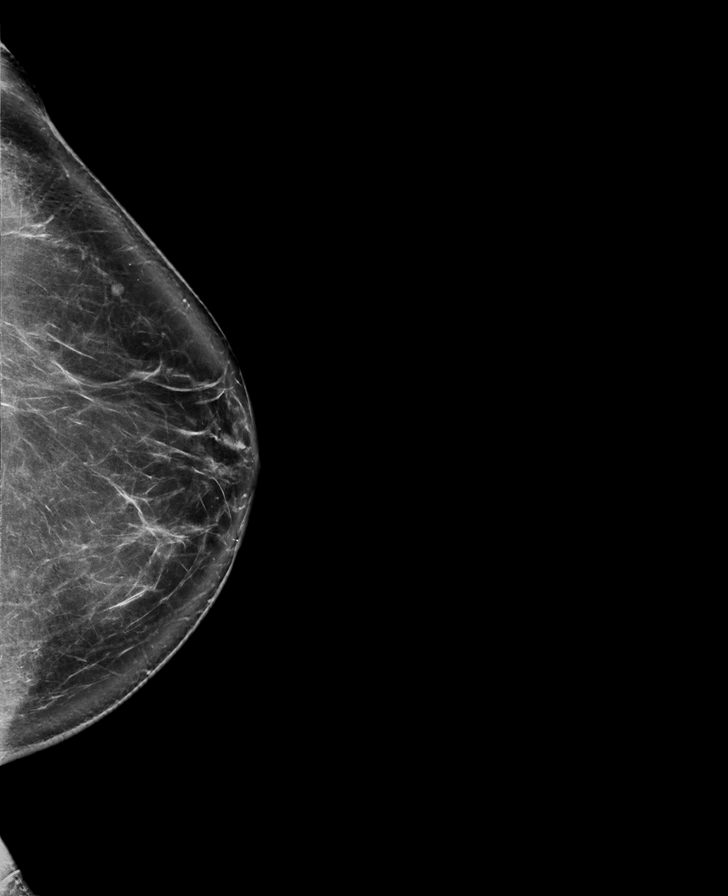

[R CC synth-2D]
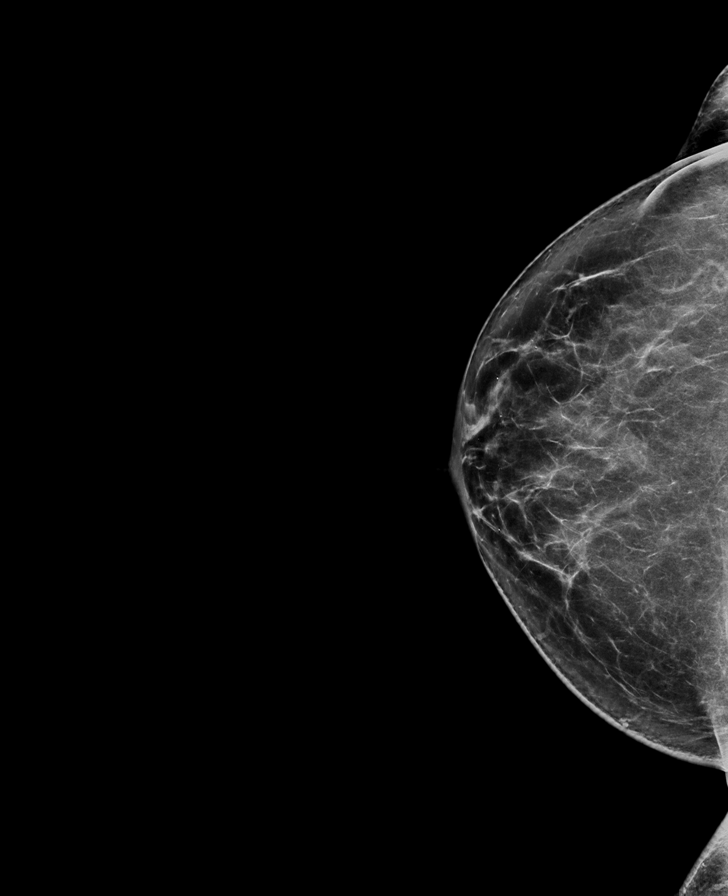

[R MLO synth-2D]
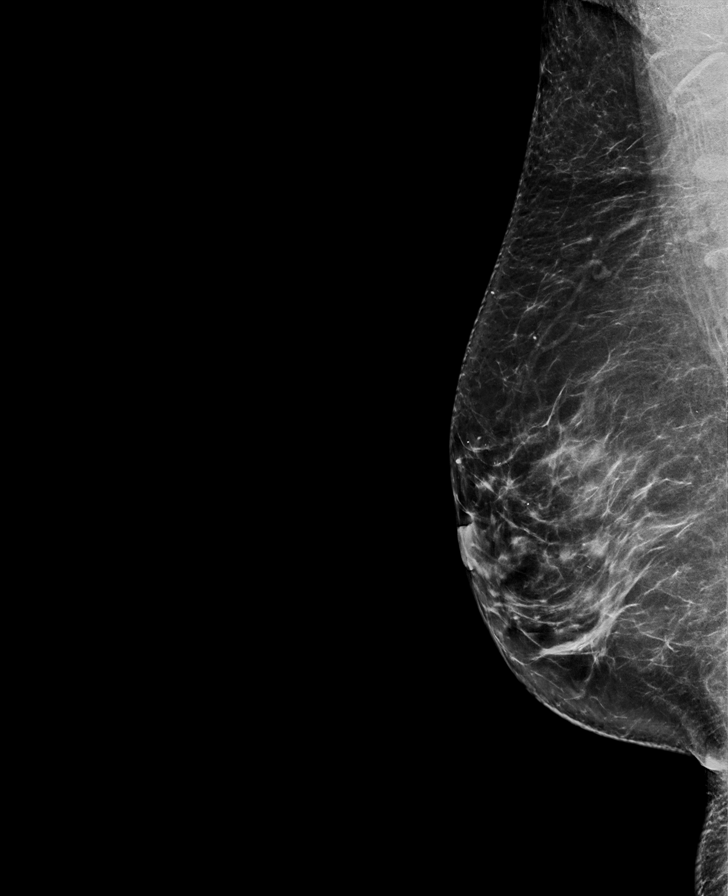

[L MLO synth-2D]
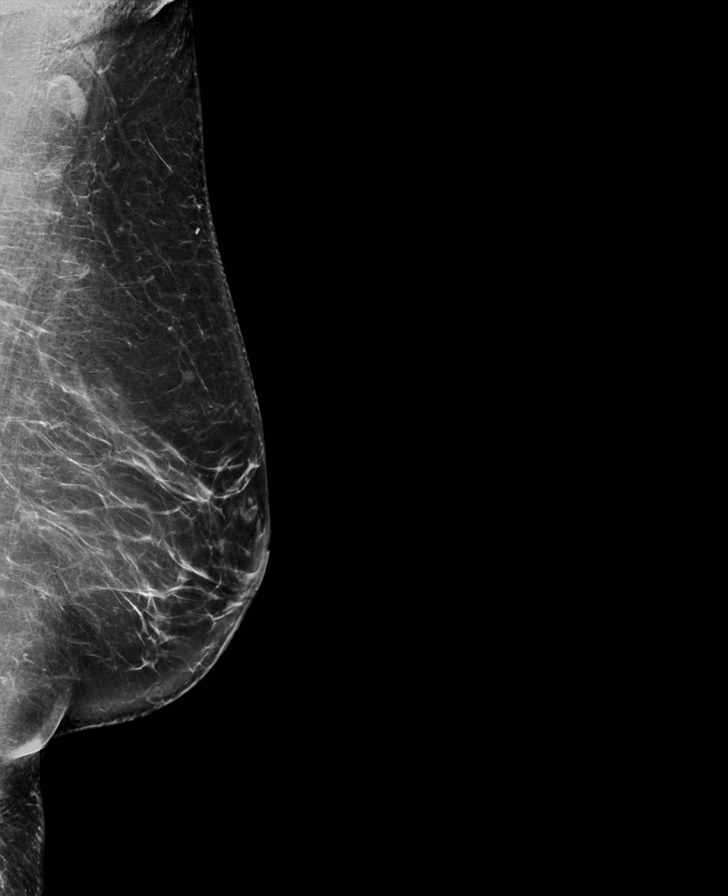

[R MLO tomo · tomo slice 44/87.0]
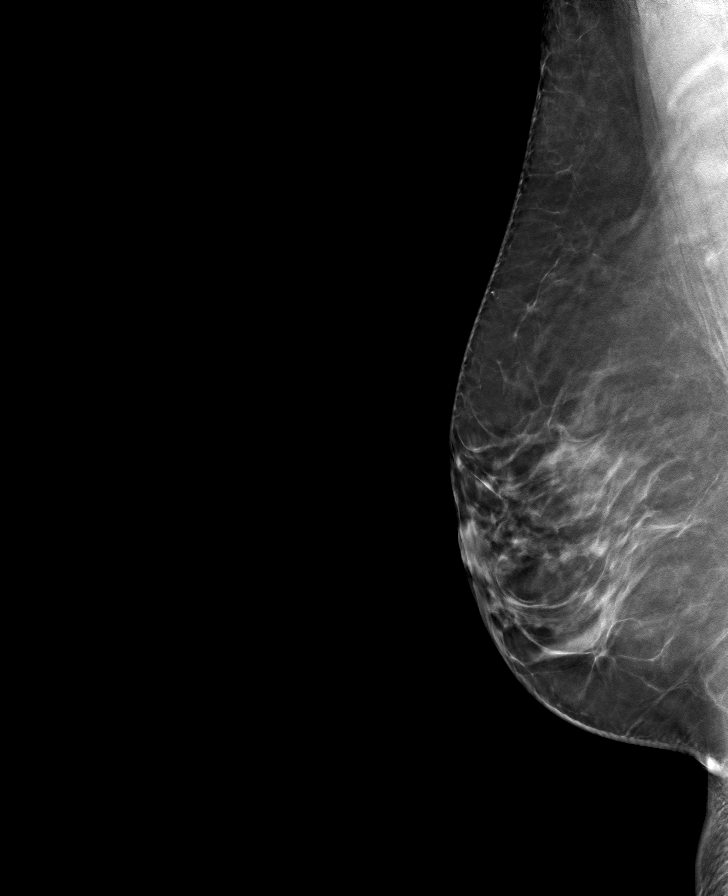

[L MLO tomo · tomo slice 47/94.0]
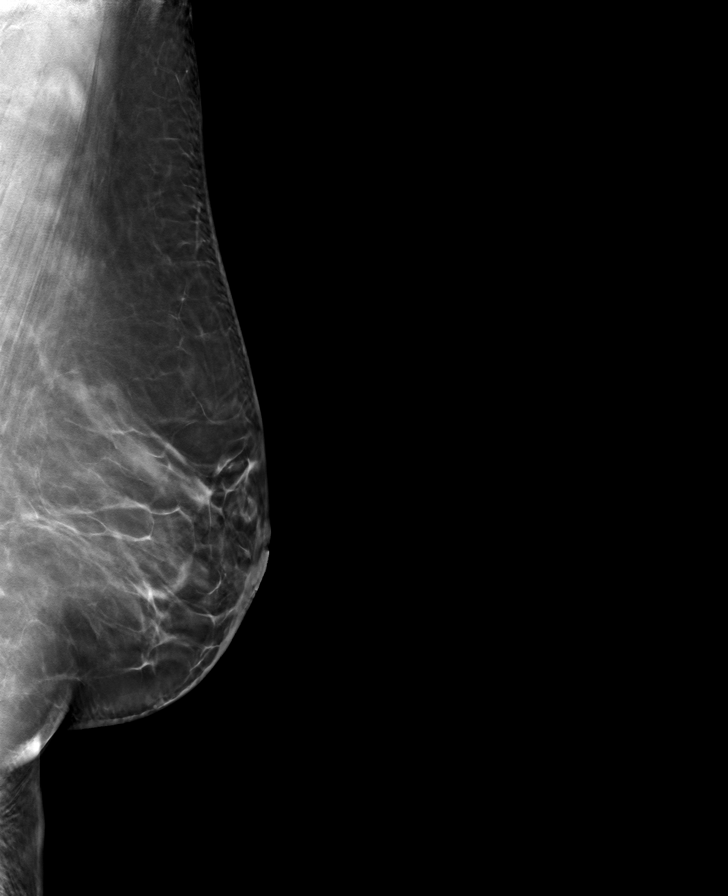

[R CC tomo · tomo slice 42/83.0]
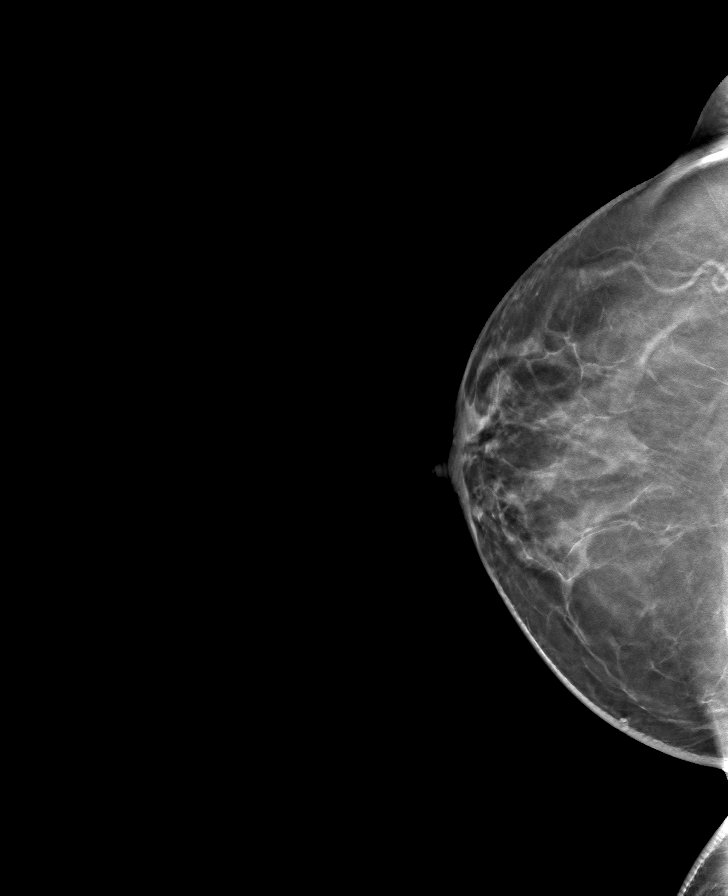

[L CC tomo · tomo slice 51/100.0]
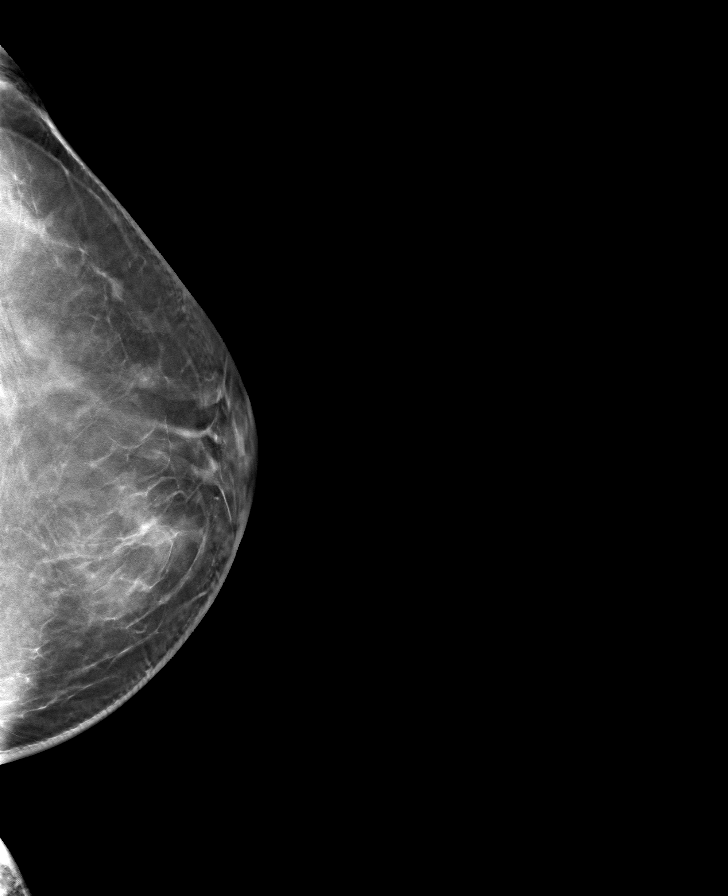

[8 of 24 positions shown; findings below may reference images not displayed]

ACR Breast Density Category b: There are scattered areas of
fibroglandular density.
FINDINGS: There are no findings suspicious for malignancy.
IMPRESSION: No mammographic evidence of malignancy. A result letter of this
screening mammogram will be mailed directly to the patient.

RECOMMENDATION:
Screening mammogram in one year. (Code:51-O-LD2)

BI-RADS CATEGORY  1: Negative.

## 2024-05-17 ENCOUNTER — Other Ambulatory Visit: Payer: Self-pay | Admitting: Internal Medicine

## 2024-05-17 DIAGNOSIS — Z1231 Encounter for screening mammogram for malignant neoplasm of breast: Secondary | ICD-10-CM

## 2024-06-23 ENCOUNTER — Encounter
